# Patient Record
Sex: Female | Born: 1935 | Race: White | Hispanic: No | Marital: Single | Smoking: Never smoker
Health system: Southern US, Community
[De-identification: ages and names within clinical notes are randomized; demographics above are authoritative.]

## PROBLEM LIST (undated history)

## (undated) DIAGNOSIS — I10 Essential (primary) hypertension: Secondary | ICD-10-CM

## (undated) DIAGNOSIS — I639 Cerebral infarction, unspecified: Secondary | ICD-10-CM

---

## 2015-09-14 ENCOUNTER — Inpatient Hospital Stay (HOSPITAL_COMMUNITY)
Admission: EM | Admit: 2015-09-14 | Discharge: 2015-09-21 | DRG: 064 | Disposition: A | Discharge status: Hospice | Payer: Medicare Other | Attending: Internal Medicine | Admitting: Internal Medicine

## 2015-09-14 ENCOUNTER — Emergency Department (HOSPITAL_COMMUNITY): Payer: Medicare Other

## 2015-09-14 ENCOUNTER — Encounter (HOSPITAL_COMMUNITY): Payer: Self-pay | Admitting: Emergency Medicine

## 2015-09-14 DIAGNOSIS — Z7902 Long term (current) use of antithrombotics/antiplatelets: Secondary | ICD-10-CM

## 2015-09-14 DIAGNOSIS — I6789 Other cerebrovascular disease: Secondary | ICD-10-CM | POA: Diagnosis present

## 2015-09-14 DIAGNOSIS — E46 Unspecified protein-calorie malnutrition: Secondary | ICD-10-CM | POA: Diagnosis present

## 2015-09-14 DIAGNOSIS — G9389 Other specified disorders of brain: Secondary | ICD-10-CM | POA: Diagnosis present

## 2015-09-14 DIAGNOSIS — E785 Hyperlipidemia, unspecified: Secondary | ICD-10-CM | POA: Diagnosis present

## 2015-09-14 DIAGNOSIS — I480 Paroxysmal atrial fibrillation: Secondary | ICD-10-CM | POA: Diagnosis not present

## 2015-09-14 DIAGNOSIS — R4182 Altered mental status, unspecified: Secondary | ICD-10-CM | POA: Diagnosis present

## 2015-09-14 DIAGNOSIS — D649 Anemia, unspecified: Secondary | ICD-10-CM | POA: Diagnosis present

## 2015-09-14 DIAGNOSIS — I248 Other forms of acute ischemic heart disease: Secondary | ICD-10-CM | POA: Diagnosis present

## 2015-09-14 DIAGNOSIS — I638 Other cerebral infarction: Secondary | ICD-10-CM | POA: Diagnosis not present

## 2015-09-14 DIAGNOSIS — R7989 Other specified abnormal findings of blood chemistry: Secondary | ICD-10-CM | POA: Diagnosis not present

## 2015-09-14 DIAGNOSIS — Z8249 Family history of ischemic heart disease and other diseases of the circulatory system: Secondary | ICD-10-CM

## 2015-09-14 DIAGNOSIS — W19XXXA Unspecified fall, initial encounter: Secondary | ICD-10-CM | POA: Diagnosis present

## 2015-09-14 DIAGNOSIS — R109 Unspecified abdominal pain: Secondary | ICD-10-CM

## 2015-09-14 DIAGNOSIS — R001 Bradycardia, unspecified: Secondary | ICD-10-CM | POA: Diagnosis present

## 2015-09-14 DIAGNOSIS — R1310 Dysphagia, unspecified: Secondary | ICD-10-CM | POA: Diagnosis present

## 2015-09-14 DIAGNOSIS — I639 Cerebral infarction, unspecified: Secondary | ICD-10-CM

## 2015-09-14 DIAGNOSIS — Z7189 Other specified counseling: Secondary | ICD-10-CM | POA: Diagnosis not present

## 2015-09-14 DIAGNOSIS — G9340 Encephalopathy, unspecified: Secondary | ICD-10-CM | POA: Diagnosis present

## 2015-09-14 DIAGNOSIS — I63411 Cerebral infarction due to embolism of right middle cerebral artery: Principal | ICD-10-CM | POA: Diagnosis present

## 2015-09-14 DIAGNOSIS — R339 Retention of urine, unspecified: Secondary | ICD-10-CM | POA: Diagnosis present

## 2015-09-14 DIAGNOSIS — I10 Essential (primary) hypertension: Secondary | ICD-10-CM | POA: Diagnosis present

## 2015-09-14 DIAGNOSIS — R29719 NIHSS score 19: Secondary | ICD-10-CM | POA: Diagnosis present

## 2015-09-14 DIAGNOSIS — Z515 Encounter for palliative care: Secondary | ICD-10-CM | POA: Diagnosis not present

## 2015-09-14 DIAGNOSIS — N183 Chronic kidney disease, stage 3 (moderate): Secondary | ICD-10-CM | POA: Diagnosis present

## 2015-09-14 DIAGNOSIS — G936 Cerebral edema: Secondary | ICD-10-CM | POA: Diagnosis present

## 2015-09-14 DIAGNOSIS — R402112 Coma scale, eyes open, never, at arrival to emergency department: Secondary | ICD-10-CM | POA: Diagnosis present

## 2015-09-14 DIAGNOSIS — R402352 Coma scale, best motor response, localizes pain, at arrival to emergency department: Secondary | ICD-10-CM | POA: Diagnosis present

## 2015-09-14 DIAGNOSIS — Z8673 Personal history of transient ischemic attack (TIA), and cerebral infarction without residual deficits: Secondary | ICD-10-CM

## 2015-09-14 DIAGNOSIS — Z823 Family history of stroke: Secondary | ICD-10-CM

## 2015-09-14 DIAGNOSIS — G8324 Monoplegia of upper limb affecting left nondominant side: Secondary | ICD-10-CM | POA: Diagnosis present

## 2015-09-14 DIAGNOSIS — Z4659 Encounter for fitting and adjustment of other gastrointestinal appliance and device: Secondary | ICD-10-CM | POA: Insufficient documentation

## 2015-09-14 DIAGNOSIS — R100 Acute abdomen: Secondary | ICD-10-CM | POA: Diagnosis not present

## 2015-09-14 DIAGNOSIS — I454 Nonspecific intraventricular block: Secondary | ICD-10-CM | POA: Diagnosis present

## 2015-09-14 DIAGNOSIS — Z87898 Personal history of other specified conditions: Secondary | ICD-10-CM | POA: Diagnosis not present

## 2015-09-14 DIAGNOSIS — I447 Left bundle-branch block, unspecified: Secondary | ICD-10-CM | POA: Diagnosis present

## 2015-09-14 DIAGNOSIS — Z66 Do not resuscitate: Secondary | ICD-10-CM | POA: Diagnosis not present

## 2015-09-14 DIAGNOSIS — I6523 Occlusion and stenosis of bilateral carotid arteries: Secondary | ICD-10-CM | POA: Diagnosis present

## 2015-09-14 DIAGNOSIS — I69320 Aphasia following cerebral infarction: Secondary | ICD-10-CM | POA: Diagnosis not present

## 2015-09-14 DIAGNOSIS — I6389 Other cerebral infarction: Secondary | ICD-10-CM

## 2015-09-14 DIAGNOSIS — R402232 Coma scale, best verbal response, inappropriate words, at arrival to emergency department: Secondary | ICD-10-CM | POA: Diagnosis present

## 2015-09-14 DIAGNOSIS — I129 Hypertensive chronic kidney disease with stage 1 through stage 4 chronic kidney disease, or unspecified chronic kidney disease: Secondary | ICD-10-CM | POA: Diagnosis present

## 2015-09-14 DIAGNOSIS — Z7982 Long term (current) use of aspirin: Secondary | ICD-10-CM

## 2015-09-14 DIAGNOSIS — I4891 Unspecified atrial fibrillation: Secondary | ICD-10-CM | POA: Diagnosis not present

## 2015-09-14 DIAGNOSIS — S0012XA Contusion of left eyelid and periocular area, initial encounter: Secondary | ICD-10-CM | POA: Diagnosis present

## 2015-09-14 DIAGNOSIS — Z79899 Other long term (current) drug therapy: Secondary | ICD-10-CM

## 2015-09-14 DIAGNOSIS — I517 Cardiomegaly: Secondary | ICD-10-CM | POA: Diagnosis present

## 2015-09-14 DIAGNOSIS — S0003XA Contusion of scalp, initial encounter: Secondary | ICD-10-CM | POA: Diagnosis present

## 2015-09-14 DIAGNOSIS — Y92009 Unspecified place in unspecified non-institutional (private) residence as the place of occurrence of the external cause: Secondary | ICD-10-CM | POA: Diagnosis not present

## 2015-09-14 HISTORY — DX: Cerebral infarction, unspecified: I63.9

## 2015-09-14 HISTORY — DX: Essential (primary) hypertension: I10

## 2015-09-14 LAB — COMPREHENSIVE METABOLIC PANEL
ALT: 17 U/L (ref 14–54)
ANION GAP: 11 (ref 5–15)
AST: 29 U/L (ref 15–41)
Albumin: 3.6 g/dL (ref 3.5–5.0)
Alkaline Phosphatase: 110 U/L (ref 38–126)
BILIRUBIN TOTAL: 0.9 mg/dL (ref 0.3–1.2)
BUN: 19 mg/dL (ref 6–20)
CO2: 21 mmol/L — ABNORMAL LOW (ref 22–32)
Calcium: 9.4 mg/dL (ref 8.9–10.3)
Chloride: 106 mmol/L (ref 101–111)
Creatinine, Ser: 1.29 mg/dL — ABNORMAL HIGH (ref 0.44–1.00)
GFR, EST AFRICAN AMERICAN: 42 mL/min — AB (ref >60)
GFR, EST NON AFRICAN AMERICAN: 36 mL/min — AB (ref >60)
Glucose, Bld: 129 mg/dL — ABNORMAL HIGH (ref 65–99)
POTASSIUM: 4.4 mmol/L (ref 3.5–5.1)
Sodium: 138 mmol/L (ref 135–145)
TOTAL PROTEIN: 6.9 g/dL (ref 6.5–8.1)

## 2015-09-14 LAB — CBC WITH DIFFERENTIAL/PLATELET
BASOS ABS: 0 10*3/uL (ref 0.0–0.1)
Basophils Relative: 0 %
EOS PCT: 1 %
Eosinophils Absolute: 0.1 10*3/uL (ref 0.0–0.7)
HEMATOCRIT: 34.9 % — AB (ref 36.0–46.0)
HEMOGLOBIN: 11.4 g/dL — AB (ref 12.0–15.0)
LYMPHS PCT: 20 %
Lymphs Abs: 1.6 10*3/uL (ref 0.7–4.0)
MCH: 31.8 pg (ref 26.0–34.0)
MCHC: 32.7 g/dL (ref 30.0–36.0)
MCV: 97.5 fL (ref 78.0–100.0)
Monocytes Absolute: 0.7 10*3/uL (ref 0.1–1.0)
Monocytes Relative: 9 %
NEUTROS ABS: 5.5 10*3/uL (ref 1.7–7.7)
NEUTROS PCT: 70 %
PLATELETS: 243 10*3/uL (ref 150–400)
RBC: 3.58 MIL/uL — AB (ref 3.87–5.11)
RDW: 13.6 % (ref 11.5–15.5)
WBC: 7.8 10*3/uL (ref 4.0–10.5)

## 2015-09-14 MED ORDER — ASPIRIN 300 MG RE SUPP
300.0000 mg | Freq: Every day | RECTAL | Status: DC
  Administered 2015-09-15 – 2015-09-16 (×2): 300 mg via RECTAL
  Filled 2015-09-14 (×2): qty 1

## 2015-09-14 MED ORDER — ASPIRIN 325 MG PO TABS
325.0000 mg | ORAL_TABLET | Freq: Every day | ORAL | Status: DC
  Administered 2015-09-17 – 2015-09-20 (×4): 325 mg via ORAL
  Filled 2015-09-14 (×4): qty 1

## 2015-09-14 MED ORDER — ACETAMINOPHEN 325 MG PO TABS
650.0000 mg | ORAL_TABLET | ORAL | Status: DC | PRN
  Administered 2015-09-18 – 2015-09-20 (×2): 650 mg via ORAL
  Filled 2015-09-14 (×2): qty 2

## 2015-09-14 MED ORDER — SODIUM CHLORIDE 0.9 % IV BOLUS (SEPSIS)
500.0000 mL | Freq: Once | INTRAVENOUS | Status: AC
  Administered 2015-09-15: 500 mL via INTRAVENOUS

## 2015-09-14 MED ORDER — SENNOSIDES-DOCUSATE SODIUM 8.6-50 MG PO TABS: 1.0000 | ORAL_TABLET | Freq: Every evening | ORAL | Status: DC | PRN

## 2015-09-14 MED ORDER — STROKE: EARLY STAGES OF RECOVERY BOOK
Freq: Once | Status: AC
  Administered 2015-09-15: 01:00:00

## 2015-09-14 MED ORDER — ACETAMINOPHEN 650 MG RE SUPP
650.0000 mg | RECTAL | Status: DC | PRN
  Administered 2015-09-15: 650 mg via RECTAL
  Filled 2015-09-14: qty 1

## 2015-09-14 NOTE — H&P (Signed)
History and Physical  Patient Name: Alexis Madden     VOJ:[id number]    DOB: 1926/05/01    DOA: 09/14/2015 Referring physician: Pixie Casino, PA-C PCP: No primary care provider on file.      Chief Complaint: Fall and AMS  HPI: Alexis Madden is a 82 y.o. female with a past medical history significant for CVA x2 with residual dysarthria and HTN who presents with stroke.  All history collected from family, as patient is unable to provide own history given mental status.  The patient lives alone, her 2 children live nearby and checks on her daily. She ambulates with a cane and is independent with all ADLs, but they provide meals for her.  Today she was last seen normal at 1 PM, when they returned at 5 PM they found her on the floor of her house. She seemed awake and had no obvious focal weakness, but appeared globally weak and they had trouble getting her up to a chair. EMS was called who found her hypertensive and brought the patient to the ER.  In the ED, the patient responded to painful stimuli only and globally weak.  CT head without contrast showed a large RIGHT parietal infarct and tPA was not administered because of this and unknown history/outside window.   Ha 138, K 4.4, Cr 1.29 (eGFR 36, unknown baseline), Hgb 11.4 normocytic, normal platelets and WBC 7.8K.  TRH were asked to evaluate for admission.        Review of Systems:  Unable to obtain due to patient mentation.  No Known Allergies  Prior to Admission medications   Medication Sig Start Date End Date Taking? Authorizing Provider  amLODipine (NORVASC) 2.5 MG tablet Take 2.5 mg by mouth daily.   Yes Historical Provider, MD  aspirin EC 81 MG tablet Take 162 mg by mouth at bedtime.   Yes Historical Provider, MD  clopidogrel (PLAVIX) 75 MG tablet Take 75 mg by mouth daily.   Yes Historical Provider, MD  Folic Acid-Vit X9-BZJ I96 (FOLBEE) 2.5-25-1 MG TABS tablet Take 1 tablet by mouth daily.   Yes Historical Provider, MD    losartan (COZAAR) 100 MG tablet Take 100 mg by mouth daily.   Yes Historical Provider, MD  lovastatin (MEVACOR) 40 MG tablet Take 40 mg by mouth at bedtime.   Yes Historical Provider, MD  METOPROLOL SUCCINATE ER PO Take 1 tablet by mouth 2 (two) times daily.   Yes Historical Provider, MD    Past Medical History  Diagnosis Date  . Stroke ([place])   . Hypertension     History reviewed. No pertinent past surgical history.  Family history: family history includes Hypertension in her other; Stroke in her mother.  Social History: Patient lives by herself.  She has two Children who live nearby who are with her most days during the day. She is able to be alone for several hours at a time. She has aphasia or dysarthria at baseline since her last one year ago and is usually not possible to understand. She uses a cane. She is not a smoker.       Physical Exam: BP 174/96 mmHg  Pulse 66  Temp(Src) 98.4 F (36.9 C) (Oral)  Resp 16  SpO2 95% General appearance: Elderly  female, eyes closed, appears to be sleeping, does not open eyes to voice, responds to noxious stimuli, intermittently follows simple commands. Eyes: Anicteric, conjunctiva pink, lids and lashes normal.  Bruising over left eye. ENT: No nasal deformity, discharge,  or epistaxis.  Lips dry, mouth not examined.  Neck normal, trachea midline.   Lymph: No cervical or supraclavicular lymphadenopathy. Skin: Warm and dry.  Bruising on upper chest and left eye.  No suspicious rashes or lesions on face, neck, back, arms, chest, abdomen.  Venous stasis changes to legs. Cardiac: RRR, nl S1-S2, soft early peaking SEM at base.  Capillary refill is brisk.  JVP not visible.  No LE edema.  Radial and DP pulses 2+ and symmetric. Respiratory: Normal respiratory rate and rhythm.  CTAB without rales or wheezes. Abdomen: Abdomen soft without rigidity.  Diffuse TTP guarding, no masses or focal tenderness, no rigidity. No ascites, distension.   MSK: No  deformities or effusions. Neuro: Eyes closed, does not respond to voice and does not consistently follow commands.  Face symmetric.  PERRLA.  Globally weak, but able to lift both arms and grip.  Babinski downgoing.   Further exam limited. Psych: Unable to assess.      Labs on Admission:  The metabolic panel shows slightly elevated creatinine. The complete blood count shows normocytic anemia.   Radiological Exams on Admission: Ct Head Wo Contrast 09/14/2015  IMPRESSION: Acute appearing infarct right parietal lobe with cytotoxic edema. Old infarcts involving the left frontal lobe, posterior left temporal lobe, and much of the left occipital lobe. A small prior infarct in the medial right occipital lobe is also evident. There is moderate small vessel disease throughout the centra semiovale bilaterally. No intracranial hemorrhage. No extra-axial fluid collections. There is a left frontal scalp hematoma. No fracture evident.   Personally reviewed: Dg Chest Port 1 View 09/14/2015  Clear without focal opacity.   EKG: Independently reviewed. Rate 75, LBBB.  No previous available for comparison.    Assessment/Plan 1. Acute Stroke/TIA:  This is new.  Two previous strokes with patient slurred and unable to speak since last 1 year ago, without muscle deficits.  Patient lives alone with assistance from family who look in on her every day.  Now, CT head shows parietal RIGHT infarct.   -Admit to telemetry -Neuro checks, NIHSS per protocol -Daily aspirin 325 mg -Permissive hypertension for now -Lipids, hemoglobin A1c -Carotid doppler, MRA or CT angiography of head and neck per Neurology, ordered -Echocardiogram ordered -PT/OT/SLP consultation -Consult to Neurology, appreciate recommendations -Restart Plavix when able to take PO   2. Elevated creatinine:  Chronicity and baseline unknown. -Fluids and trend BMP -Check urinalysis  3. HTN:  -Permissive hypertension for now, hold losartan,  amlodipine, metoprolol  4. Anemia:  Also, unclear etiology/chronicity.  Normocytic, likely from renal disease. -Check ferritin      DVT PPx: SCDs Diet: Heart healthy if passes swallow screen Consultants: Neurology Code Status: DO NOT RESUSCITATE Family Communication: Daughter and son and daughter in law at bedside. POA is son, CODE STATUS discussed. Medical decision making: The case was discussed with [facility] and Dr. Nicole Kindred. Patient seen 10:15 PM on 09/14/2015.  Disposition Plan:  I recommend admission to telemetry, inpatient status.  Clinical condition: stable.  Anticipate evaluation by ancillary services, secondary prevention regimen to be started by Neurology, placement likely needed.      Edwin Dada [facility] Pager [phone]

## 2015-09-14 NOTE — ED Notes (Signed)
Unable to review pts hx, allergies, medications or any other screening questions due to pts mental status. Family in route with information.

## 2015-09-14 NOTE — ED Notes (Signed)
Per EMS, pt coming from home pt was last seen at 1330 today and was normal to baseline. At baseline pt has expressive aphasia but is alert. Pt was then found by family face down on the floor around 1630. Pt is altered at this time and only responds to painful stimuli. Pt does have obvious hematoma to the left forehead and is on a blood thinner.

## 2015-09-14 NOTE — Consult Note (Signed)
Admission H&P    Chief Complaint: Acute recurrent stroke.  HPI: Alexis Madden is an 82 y.o. female with a history of hypertension and hyperlipidemia as well as previous left cerebral infarctions with residual aphasia, brought to the emergency room after being found on the floor of the home [place] and. She was last seen well at about 1:30 PM. She was noted to have ecchymosis and swelling involving the left frontal and periorbital region. She was stuporous and has remained stuporous. CT scan of her head showed an acute right parietal area of hypoattenuation, likely acute infarction, in addition to previous left cerebral infarctions. Patient has been taking aspirin and Plavix daily.  LSN: 1:30 PM on 09/14/2015 tPA Given: No: Large acute cerebral infarction demonstrated on CT scan; beyond time window for treatment consideration. mRankin:  Past Medical History  Diagnosis Date  . Stroke ([place])   . Hypertension     History reviewed. No pertinent past surgical history.  Family History  Problem Relation Age of Onset  . Stroke Mother   . Hypertension Other    Social History:  reports that she has never smoked. She has never used smokeless tobacco. She reports that she does not drink alcohol or use illicit drugs.  Allergies: No Known Allergies  Medications Prior to Admission  Medication Sig Dispense Refill  . amLODipine (NORVASC) 2.5 MG tablet Take 2.5 mg by mouth daily.    Marland Kitchen aspirin EC 81 MG tablet Take 162 mg by mouth at bedtime.    . clopidogrel (PLAVIX) 75 MG tablet Take 75 mg by mouth daily.    . Folic Acid-Vit Z6-XWR U04 (FOLBEE) 2.5-25-1 MG TABS tablet Take 1 tablet by mouth daily.    Marland Kitchen losartan (COZAAR) 100 MG tablet Take 100 mg by mouth daily.    Marland Kitchen lovastatin (MEVACOR) 40 MG tablet Take 40 mg by mouth at bedtime.    Marland Kitchen METOPROLOL SUCCINATE ER PO Take 1 tablet by mouth 2 (two) times daily.      ROS: History obtained from patient's daughter  General ROS: negative for - chills,  fatigue, fever, night sweats, weight gain or weight loss Psychological ROS: negative for - behavioral disorder, hallucinations, memory difficulties, mood swings or suicidal ideation Ophthalmic ROS: negative for - blurry vision, double vision, eye pain or loss of vision ENT ROS: negative for - epistaxis, nasal discharge, oral lesions, sore throat, tinnitus or vertigo Allergy and Immunology ROS: negative for - hives or itchy/watery eyes Hematological and Lymphatic ROS: negative for - bleeding problems, bruising or swollen lymph nodes Endocrine ROS: negative for - galactorrhea, hair pattern changes, polydipsia/polyuria or temperature intolerance Respiratory ROS: negative for - cough, hemoptysis, shortness of breath or wheezing Cardiovascular ROS: negative for - chest pain, dyspnea on exertion, edema or irregular heartbeat Gastrointestinal ROS: negative for - abdominal pain, diarrhea, hematemesis, nausea/vomiting or stool incontinence Genito-Urinary ROS: negative for - dysuria, hematuria, incontinence or urinary frequency/urgency Musculoskeletal ROS: negative for - joint swelling or muscular weakness Neurological ROS: as noted in HPI Dermatological ROS: negative for rash and skin lesion changes  Physical Examination: Blood pressure 174/96, pulse 66, temperature 98.4 F (36.9 C), temperature source Oral, resp. rate 16, SpO2 95 %.  HEENT-  Normocephalic, no lesions, without obvious abnormality.  Normal external eye and conjunctiva.  Normal TM's bilaterally.  Normal auditory canals and external ears. Normal external nose, mucus membranes and septum.  Normal pharynx. Neck supple with no masses, nodes, nodules or enlargement. Cardiovascular - regular rate and rhythm, S1, S2 normal,  no murmur, click, rub or gallop Lungs - chest clear, no wheezing, rales, normal symmetric air entry, Heart exam - S1, S2 normal, no murmur, no gallop, rate regular Abdomen - soft, non-tender; bowel sounds normal; no masses,   no organomegaly Extremities - no joint deformities, effusion, or inflammation and no edema  Neurologic Examination: Mental Status: Patient was stuporous with no verbal output when partially arouse. He did not follow simple commands. Cranial Nerves: Unable to adequately evaluate due to mental status. III/IV/VI-Pupils were equal and reacted. Extraocular movements were full on right left lateral gaze with oculocephalic maneuvers.    VII-moderate left lower facial weakness. VIII-normal. X-garbled verbal output in response to noxious stimuli. Motor: Reduced spontaneous movements of left extremities compared to right extremities; pain with passive movement of left upper extremity. Deep Tendon Reflexes: 2+ and symmetric. Plantars: Flexor bilaterally Cerebellar: Not tested. Carotid auscultation: Normal  Results for orders placed or performed during the hospital encounter of 09/14/15 (from the past 48 hour(s))  Comprehensive metabolic panel     Status: Abnormal   Collection Time: 09/14/15  7:59 PM  Result Value Ref Range   Sodium 138 135 - 145 mmol/L   Potassium 4.4 3.5 - 5.1 mmol/L   Chloride 106 101 - 111 mmol/L   CO2 21 (L) 22 - 32 mmol/L   Glucose, Bld 129 (H) 65 - 99 mg/dL   BUN 19 6 - 20 mg/dL   Creatinine, Ser 1.29 (H) 0.44 - 1.00 mg/dL   Calcium 9.4 8.9 - 10.3 mg/dL   Total Protein 6.9 6.5 - 8.1 g/dL   Albumin 3.6 3.5 - 5.0 g/dL   AST 29 15 - 41 U/L   ALT 17 14 - 54 U/L   Alkaline Phosphatase 110 38 - 126 U/L   Total Bilirubin 0.9 0.3 - 1.2 mg/dL   GFR calc non Af Amer 36 (L) >60 mL/min   GFR calc Af Amer 42 (L) >60 mL/min    Comment: (NOTE) The eGFR has been calculated using the CKD EPI equation. This calculation has not been validated in all clinical situations. eGFR's persistently <60 mL/min signify possible Chronic Kidney Disease.    Anion gap 11 5 - 15  CBC with Differential     Status: Abnormal   Collection Time: 09/14/15  7:59 PM  Result Value Ref Range   WBC 7.8  4.0 - 10.5 K/uL   RBC 3.58 (L) 3.87 - 5.11 MIL/uL   Hemoglobin 11.4 (L) 12.0 - 15.0 g/dL   HCT 34.9 (L) 36.0 - 46.0 %   MCV 97.5 78.0 - 100.0 fL   MCH 31.8 26.0 - 34.0 pg   MCHC 32.7 30.0 - 36.0 g/dL   RDW 13.6 11.5 - 15.5 %   Platelets 243 150 - 400 K/uL   Neutrophils Relative % 70 %   Neutro Abs 5.5 1.7 - 7.7 K/uL   Lymphocytes Relative 20 %   Lymphs Abs 1.6 0.7 - 4.0 K/uL   Monocytes Relative 9 %   Monocytes Absolute 0.7 0.1 - 1.0 K/uL   Eosinophils Relative 1 %   Eosinophils Absolute 0.1 0.0 - 0.7 K/uL   Basophils Relative 0 %   Basophils Absolute 0.0 0.0 - 0.1 K/uL   Ct Head Wo Contrast  09/14/2015  CLINICAL DATA:  Altered mental status with apparent fall EXAM: CT HEAD WITHOUT CONTRAST TECHNIQUE: Contiguous axial images were obtained from the base of the skull through the vertex without intravenous contrast. COMPARISON:  None. FINDINGS: There is moderate  diffuse atrophy. There is no intracranial mass, hemorrhage, extra-axial fluid collection, or midline shift. There is evidence of prior infarcts involving the posterior temporal and anterior to mid left occipital lobes as well as in the mid left frontal lobe. There is small vessel disease throughout the centra semiovale bilaterally. There is evidence of a prior small infarct in the posterior medial right occipital lobe. There is an acute appearing infarct in the right parietal lobe with cytotoxic edema in this area. No other acute infarct is evident. The bony calvarium appears intact. There is a left frontal scalp hematoma. The mastoid air cells are clear. Orbits appear symmetric bilaterally. IMPRESSION: Acute appearing infarct right parietal lobe with cytotoxic edema. Old infarcts involving the left frontal lobe, posterior left temporal lobe, and much of the left occipital lobe. A small prior infarct in the medial right occipital lobe is also evident. There is moderate small vessel disease throughout the centra semiovale bilaterally. No  intracranial hemorrhage. No extra-axial fluid collections. There is a left frontal scalp hematoma. No fracture evident. Electronically Signed   By: Lowella Grip III M.D.   On: 09/14/2015 20:45   Dg Chest Port 1 View  09/14/2015  CLINICAL DATA:  Fall EXAM: PORTABLE CHEST 1 VIEW COMPARISON:  None. FINDINGS: Lungs are clear. Heart is enlarged with pulmonary vascularity within normal limits. No adenopathy. There is a skin fold on the right but no pneumothorax. No fractures are evident. There is degenerative change in the shoulders bilaterally. Atherosclerotic calcification is noted in the aortic arch region. IMPRESSION: No edema or consolidation. There is cardiomegaly. There is a skin fold on the right but no apparent pneumothorax. Electronically Signed   By: Lowella Grip III M.D.   On: 09/14/2015 19:34    Assessment: 82 y.o. female with multiple risk factors for stroke as well as previous cerebral infarctions presenting with acute right parietal ischemic infarction.  Stroke Risk Factors - family history, hyperlipidemia and hypertension  Plan: 1. HgbA1c, fasting lipid panel 2. MRI, MRA  of the brain without contrast 3. PT consult, OT consult, Speech consult 4. Echocardiogram 5. Carotid dopplers 6. Prophylactic therapy-continue aspirin and Plavix 7. Risk factor modification 8. Telemetry monitoring  C.R. Nicole Kindred, MD [facility] [phone]  09/14/2015, 10:56 PM

## 2015-09-14 NOTE — ED Notes (Signed)
Held almost 10 minutes waiting to give report.

## 2015-09-14 NOTE — ED Provider Notes (Signed)
Medical screening examination/treatment/procedure(s) were conducted as a shared visit with non-physician practitioner(s) and myself.  I personally evaluated the patient during the encounter.  Last seen well around noon, family came back this afternoon and pateint with obvious trauma to left forehead and left orbit, decreased LOC, brought here for eval. Here if you open her eyelids she will obey commands. , otherwise will not move extremities.  Concern for head bleed vs. Concussion. Will ct and check other labs and disposition appropriately.   Ct w/ e/o acute infarct. Last seen well at 1200 so no code stroke done. Plan for neuro consult and IM admission.   CRITICAL CARE Performed by: Marily MemosMesner, Virdell Hoiland  Total critical care time: 35 minutes Critical care time was exclusive of separately billable procedures and treating other patients. Critical care was necessary to treat or prevent imminent or life-threatening deterioration. Critical care was time spent personally by me on the following activities: development of treatment plan with patient and/or surrogate as well as nursing, discussions with consultants, evaluation of patient's response to treatment, examination of patient, obtaining history from patient or surrogate, ordering and performing treatments and interventions, ordering and review of laboratory studies, ordering and review of radiographic studies, pulse oximetry and re-evaluation of patient's condition.   Marily MemosJason Sharyon Peitz, MD 09/15/15 1101

## 2015-09-14 NOTE — Progress Notes (Addendum)
Patient arrived to 205M03. Patient is alert, oriented to self, expressive aphasia prevents patient from answering other orientation questions. Patient unable to follow all commands, skin is intact, patient moves self around in bed often, no signs of distress, patient's son Alexis Madden and wife Alexis Madden updated on plan of care, unit, staff. Call bell within patient's reach, bed alarm on, will continue to monitor patient closely. Patient currently too drowsy to complete stroke swallow screen, will keep patient npo until able to stay awake for swallow screen. Q2 vitals and neuro checks started at 11:00 pm, scds ordered for patient  MD danford came to patient's bedside, spoke w family upon patient's arrival to floor  Addendum: patient has ecchymosis on anterior chest with possible hematoma, multiple ecchymosis on right and left arms, left eye is also bruised, tender, not open. Clean.

## 2015-09-14 NOTE — ED Provider Notes (Signed)
CSN: [id number]     Arrival date & time 09/14/15  1902 History   First MD Initiated Contact with Patient 09/14/15 1912     Chief Complaint  Patient presents with  . Altered Mental Status  . Fall    (Consider location/radiation/quality/duration/timing/severity/associated sxs/prior Treatment) Patient is a 82 y.o. female presenting with altered mental status and fall. The history is provided by a relative and medical records. No language interpreter was used.  Altered Mental Status Fall  Alexis Madden is a 82 y.o. female  with a PMH of HTN and prior stroke who presents to the Emergency Department via EMS for altered mental status. Per son and daughter at bedside, patient has expressive aphasia at baseline 2/2 prior stroke, but lives alone, able to perform ADL's, feed herself, ambulate by herself, etc. She was last seen normal at approximately 12-1PM this afternoon. Daughter went to check on her at approximately 5:30 this afternoon and found her laying flat on floor and unresponsive with trauma to left forehead which daughter assumes is from a fall. Patient is on clopidogrel. No medications given PTA.   Level V caveat applies due to mental status.  Past Medical History  Diagnosis Date  . Stroke (HCC)   . Hypertension    History reviewed. No pertinent past surgical history. History reviewed. No pertinent family history. Social History  Substance Use Topics  . Smoking status: Never Smoker   . Smokeless tobacco: Never Used  . Alcohol Use: No   OB History    No data available     Review of Systems  Unable to perform ROS: Mental status change      Allergies  Review of patient's allergies indicates no known allergies.  Home Medications   Prior to Admission medications   Medication Sig Start Date End Date Taking? Authorizing Provider  amLODipine (NORVASC) 2.5 MG tablet Take 2.5 mg by mouth daily.   Yes Historical Provider, MD  aspirin EC 81 MG tablet Take 162 mg by mouth at  bedtime.   Yes Historical Provider, MD  clopidogrel (PLAVIX) 75 MG tablet Take 75 mg by mouth daily.   Yes Historical Provider, MD  Folic Acid-Vit B6-Vit B12 (FOLBEE) 2.5-25-1 MG TABS tablet Take 1 tablet by mouth daily.   Yes Historical Provider, MD  losartan (COZAAR) 100 MG tablet Take 100 mg by mouth daily.   Yes Historical Provider, MD  lovastatin (MEVACOR) 40 MG tablet Take 40 mg by mouth at bedtime.   Yes Historical Provider, MD  METOPROLOL SUCCINATE ER PO Take 1 tablet by mouth 2 (two) times daily.   Yes Historical Provider, MD   BP 174/96 mmHg  Pulse 66  Temp(Src) 98.4 F (36.9 C) (Oral)  Resp 16  SpO2 95% Physical Exam  Constitutional: She appears well-developed and well-nourished.  HENT:  Head: Head is without raccoon's eyes and without Battle's sign.  Right Ear: No hemotympanum.  Left Ear: No hemotympanum.  Hematoma of left forehead. Bruising of her left eyelid.  Eyes: Conjunctivae are normal. Pupils are equal, round, and reactive to light.  Cardiovascular: Normal rate and regular rhythm.   Pulmonary/Chest: Effort normal and breath sounds normal. No respiratory distress.  Abdominal: Soft. Bowel sounds are normal. She exhibits no distension. There is no tenderness.  Musculoskeletal:  Decreased strength of left upper extremity  Neurological:  Altered. Responds to painful stimuli. Will follow basic commands at times.  Skin: Skin is warm and dry.  Nursing note and vitals reviewed.   ED Course  Procedures (including critical care time) Labs Review Labs Reviewed  COMPREHENSIVE METABOLIC PANEL - Abnormal; Notable for the following:    CO2 21 (*)    Glucose, Bld 129 (*)    Creatinine, Ser 1.29 (*)    GFR calc non Af Amer 36 (*)    GFR calc Af Amer 42 (*)    All other components within normal limits  CBC WITH DIFFERENTIAL/PLATELET - Abnormal; Notable for the following:    RBC 3.58 (*)    Hemoglobin 11.4 (*)    HCT 34.9 (*)    All other components within normal limits   URINALYSIS, ROUTINE W REFLEX MICROSCOPIC (NOT AT [facility])  CBG MONITORING, ED    Imaging Review Ct Head Wo Contrast  09/14/2015  CLINICAL DATA:  Altered mental status with apparent fall EXAM: CT HEAD WITHOUT CONTRAST TECHNIQUE: Contiguous axial images were obtained from the base of the skull through the vertex without intravenous contrast. COMPARISON:  None. FINDINGS: There is moderate diffuse atrophy. There is no intracranial mass, hemorrhage, extra-axial fluid collection, or midline shift. There is evidence of prior infarcts involving the posterior temporal and anterior to mid left occipital lobes as well as in the mid left frontal lobe. There is small vessel disease throughout the centra semiovale bilaterally. There is evidence of a prior small infarct in the posterior medial right occipital lobe. There is an acute appearing infarct in the right parietal lobe with cytotoxic edema in this area. No other acute infarct is evident. The bony calvarium appears intact. There is a left frontal scalp hematoma. The mastoid air cells are clear. Orbits appear symmetric bilaterally. IMPRESSION: Acute appearing infarct right parietal lobe with cytotoxic edema. Old infarcts involving the left frontal lobe, posterior left temporal lobe, and much of the left occipital lobe. A small prior infarct in the medial right occipital lobe is also evident. There is moderate small vessel disease throughout the centra semiovale bilaterally. No intracranial hemorrhage. No extra-axial fluid collections. There is a left frontal scalp hematoma. No fracture evident. Electronically Signed   By: Bretta BangWilliam  Woodruff III M.D.   On: 09/14/2015 20:45   Dg Chest Port 1 View  09/14/2015  CLINICAL DATA:  Fall EXAM: PORTABLE CHEST 1 VIEW COMPARISON:  None. FINDINGS: Lungs are clear. Heart is enlarged with pulmonary vascularity within normal limits. No adenopathy. There is a skin fold on the right but no pneumothorax. No fractures are evident. There  is degenerative change in the shoulders bilaterally. Atherosclerotic calcification is noted in the aortic arch region. IMPRESSION: No edema or consolidation. There is cardiomegaly. There is a skin fold on the right but no apparent pneumothorax. Electronically Signed   By: Bretta BangWilliam  Woodruff III M.D.   On: 09/14/2015 19:34   I have personally reviewed and evaluated these images and lab results as part of my medical decision-making.   EKG Interpretation None      MDM   Final diagnoses:  Infarction of parietal lobe (HCC)   Alexis Madden is a 82 y.o. female who presents with family to ED after being found down approximately an hour and a half prior to arrival. Last seen normal was approximately noon (7 hours PTA). Per family at bedside, significant mental status change from baseline. CBC and CMP reviewed. Chest x-ray with cardiomegaly, no edema or consolidation. Head CT shows an acute appearing infarct of the right parietal lobe. Neurology, Dr. Roseanne RenoStewart, was consulted who recommends admission and patient will be seen by neurology in consult. Consult to the hospitalist,  Dr. Maryfrances Bunnell who will admit.   Patient seen by and discussed with Dr. Clayborne Dana who agrees with treatment plan.    [facility] Charvi Gammage, PA-C 09/14/15 2151  Marily Memos, MD 09/15/15 1102

## 2015-09-14 NOTE — Progress Notes (Signed)
This RN unaware that RN was on hold for report. RN accepted report as soon as I was made aware of nurse calling to give report.

## 2015-09-15 ENCOUNTER — Other Ambulatory Visit (HOSPITAL_COMMUNITY): Payer: Medicare Other

## 2015-09-15 ENCOUNTER — Encounter (HOSPITAL_COMMUNITY): Payer: Self-pay | Admitting: Radiology

## 2015-09-15 ENCOUNTER — Inpatient Hospital Stay (HOSPITAL_COMMUNITY): Payer: Medicare Other

## 2015-09-15 DIAGNOSIS — N183 Chronic kidney disease, stage 3 (moderate): Secondary | ICD-10-CM

## 2015-09-15 LAB — BASIC METABOLIC PANEL
Anion gap: 11 (ref 5–15)
BUN: 16 mg/dL (ref 6–20)
CALCIUM: 8.9 mg/dL (ref 8.9–10.3)
CHLORIDE: 106 mmol/L (ref 101–111)
CO2: 22 mmol/L (ref 22–32)
CREATININE: 1.22 mg/dL — AB (ref 0.44–1.00)
GFR, EST AFRICAN AMERICAN: 44 mL/min — AB (ref >60)
GFR, EST NON AFRICAN AMERICAN: 38 mL/min — AB (ref >60)
Glucose, Bld: 118 mg/dL — ABNORMAL HIGH (ref 65–99)
Potassium: 3.9 mmol/L (ref 3.5–5.1)
SODIUM: 139 mmol/L (ref 135–145)

## 2015-09-15 LAB — URINALYSIS, ROUTINE W REFLEX MICROSCOPIC
Bilirubin Urine: NEGATIVE
GLUCOSE, UA: NEGATIVE mg/dL
KETONES UR: NEGATIVE mg/dL
LEUKOCYTES UA: NEGATIVE
Nitrite: NEGATIVE
PH: 6 (ref 5.0–8.0)
Protein, ur: 30 mg/dL — AB
Specific Gravity, Urine: 1.009 (ref 1.005–1.030)

## 2015-09-15 LAB — URINE MICROSCOPIC-ADD ON: WBC UA: NONE SEEN WBC/hpf (ref 0–5)

## 2015-09-15 LAB — GLUCOSE, CAPILLARY
GLUCOSE-CAPILLARY: 106 mg/dL — AB (ref 65–99)
Glucose-Capillary: 100 mg/dL — ABNORMAL HIGH (ref 65–99)
Glucose-Capillary: 98 mg/dL (ref 65–99)

## 2015-09-15 LAB — CBC
HEMATOCRIT: 31.3 % — AB (ref 36.0–46.0)
Hemoglobin: 10.2 g/dL — ABNORMAL LOW (ref 12.0–15.0)
MCH: 32 pg (ref 26.0–34.0)
MCHC: 32.6 g/dL (ref 30.0–36.0)
MCV: 98.1 fL (ref 78.0–100.0)
Platelets: 215 10*3/uL (ref 150–400)
RBC: 3.19 MIL/uL — ABNORMAL LOW (ref 3.87–5.11)
RDW: 13.5 % (ref 11.5–15.5)
WBC: 7.6 10*3/uL (ref 4.0–10.5)

## 2015-09-15 LAB — FERRITIN: Ferritin: 34 ng/mL (ref 11–307)

## 2015-09-15 LAB — LIPID PANEL
Cholesterol: 138 mg/dL (ref 0–200)
HDL: 56 mg/dL (ref >40)
LDL CALC: 67 mg/dL (ref 0–99)
Total CHOL/HDL Ratio: 2.5 RATIO
Triglycerides: 73 mg/dL (ref <150)
VLDL: 15 mg/dL (ref 0–40)

## 2015-09-15 MED ORDER — PANTOPRAZOLE SODIUM 40 MG IV SOLR
40.0000 mg | Freq: Every day | INTRAVENOUS | Status: DC
  Administered 2015-09-15 – 2015-09-19 (×5): 40 mg via INTRAVENOUS
  Filled 2015-09-15 (×4): qty 40

## 2015-09-15 MED ORDER — SODIUM CHLORIDE 0.9 % IV SOLN
INTRAVENOUS | Status: DC
  Administered 2015-09-15 – 2015-09-20 (×4): via INTRAVENOUS

## 2015-09-15 MED ORDER — LORAZEPAM 2 MG/ML IJ SOLN: 1.0000 mg | Freq: Once | INTRAMUSCULAR | Status: DC

## 2015-09-15 MED ORDER — ENOXAPARIN SODIUM 40 MG/0.4ML ~~LOC~~ SOLN
40.0000 mg | SUBCUTANEOUS | Status: DC
  Administered 2015-09-15 – 2015-09-19 (×5): 40 mg via SUBCUTANEOUS
  Filled 2015-09-15 (×5): qty 0.4

## 2015-09-15 MED ORDER — ENOXAPARIN SODIUM 40 MG/0.4ML ~~LOC~~ SOLN: 40.0000 mg | SUBCUTANEOUS | Status: DC

## 2015-09-15 MED ORDER — IOPAMIDOL (ISOVUE-370) INJECTION 76%
INTRAVENOUS | Status: AC
  Administered 2015-09-15: 50 mL
  Filled 2015-09-15: qty 50

## 2015-09-15 NOTE — Evaluation (Signed)
Clinical/Bedside Swallow Evaluation Patient Details  Name: Alexis ReamerMinnie Madden MRN: [id number] Date of Birth: 04/05/1926  Today's Date: 09/15/2015 Time: SLP Start Time (ACUTE ONLY): 78290923 SLP Stop Time (ACUTE ONLY): 0932 SLP Time Calculation (min) (ACUTE ONLY): 9 min  Past Medical History:  Past Medical History  Diagnosis Date  . Stroke (HCC)   . Hypertension    Past Surgical History: History reviewed. No pertinent past surgical history. HPI:  82 y.o. female with h/o CVA x2 with residual dysarthria/aphasia and HTN who presented to ED with s/s of CVA. Per MD note, pt had last stroke 1 year ago and family has been unable to understand her since. CT Head 4/12 acte appearing infarct R parietal lobe with cytotoxic edema. Old infarct involving the L frontal lobe, posterior L temporal lobe and much of L occipital lobe. Small prior infarct in medial R occipital lobe also evident. CXR 4/12 no edema or consolidation. No prior SLP intervention found in chart.   Assessment / Plan / Recommendation Clinical Impression  Pt presented with ice chip x1, however did not appear aware of bolus once in oral cavity. No pharyngeal swallow initiation observed. No other PO provided due to somnolence and poor awareness. Recommend pt remain NPO. SLP will f/u to determine PO readiness.    Aspiration Risk  Severe aspiration risk    Diet Recommendation NPO   Medication Administration: Via alternative means    Other  Recommendations Oral Care Recommendations: Oral care QID   Follow up Recommendations   (TBD)    Frequency and Duration min 2x/week  2 weeks       Prognosis Prognosis for Safe Diet Advancement: Fair Barriers to Reach Goals: Cognitive deficits;Time post onset;Severity of deficits      Swallow Study   General HPI: 82 y.o. female with h/o CVA x2 with residual dysarthria/aphasia and HTN who presented to ED with s/s of CVA. Per MD note, pt had last stroke 1 year ago and family has been unable to understand  her since. CT Head 4/12 acte appearing infarct R parietal lobe with cytotoxic edema. Old infarct involving the L frontal lobe, posterior L temporal lobe and much of L occipital lobe. Small prior infarct in medial R occipital lobe also evident. CXR 4/12 no edema or consolidation. No prior SLP intervention found in chart. Type of Study: Bedside Swallow Evaluation Previous Swallow Assessment: see HPI Diet Prior to this Study: NPO Temperature Spikes Noted: No Respiratory Status: Room air History of Recent Intubation: No Behavior/Cognition: Lethargic/Drowsy;Requires cueing;Distractible;Confused Oral Cavity Assessment: Within Functional Limits Oral Care Completed by SLP: Yes Oral Cavity - Dentition: Adequate natural dentition Vision:  (unable to determine) Self-Feeding Abilities: Total assist Patient Positioning: Upright in bed Baseline Vocal Quality: Low vocal intensity;Other (comment) (intelligibility reduced) Volitional Cough: Cognitively unable to elicit Volitional Swallow: Unable to elicit    Oral/Motor/Sensory Function Overall Oral Motor/Sensory Function: Within functional limits (limited due to inability to follow commands)   Ice Chips Ice chips: Impaired Presentation: Spoon Oral Phase Impairments: Poor awareness of bolus Oral Phase Functional Implications: Right lateral sulci pocketing;Prolonged oral transit Pharyngeal Phase Impairments: Suspected delayed Swallow   Thin Liquid Thin Liquid: Not tested    Nectar Thick Nectar Thick Liquid: Not tested   Honey Thick Honey Thick Liquid: Not tested   Puree Puree: Not tested   Solid   Jaicob Dia, Student-SLP Solid: Not tested        Lynita LombardLauren Anne Boltz 09/15/2015,10:01 AM

## 2015-09-15 NOTE — Progress Notes (Signed)
NP placed order for ativan once for mri, if patient unable to tolerate mri, rn to notify neurology and wait until am. Patient already coming back from mri, mri to notify rn once they can attempt to scan patient again. RN will administer ativan before

## 2015-09-15 NOTE — Progress Notes (Signed)
OT Cancellation Note  Patient Details Name: Alexis ReamerMinnie Madden MRN: [id number] DOB: 1926-05-05   Cancelled Treatment:    Reason Eval/Treat Not Completed: Patient at procedure or test/ unavailable (Per RN, pt currently at CT). Will follow up for OT eval as time allows.   [name] M.S., OTR/L Pager: [phone]  09/15/2015, 11:56 AM

## 2015-09-15 NOTE — Progress Notes (Signed)
Per MRI, patient unable to stay still to complete mri. On call np notified that patient unable to stay still for mri, pt currently drowsy. No new orders.

## 2015-09-15 NOTE — Progress Notes (Addendum)
[facility] Progress Note  Patient: Alexis Madden:[id number]   PCP: No primary care provider on file. DOB: [birth year]   DOA: 09/14/2015   DOS: 09/15/2015   Date of Service: the patient was seen and examined on 09/15/2015  Subjective: The patient is not communicating verbally and therefore review of system is limited although denies having any acute complaint. Nutrition: Nothing by mouth at present  Brief hospital course: Patient was admitted on 09/14/2015, with complaint of fall, was found to have right parietal stroke. Currently further plan is further stroke workup and therapy.  Assessment and Plan: 1. Acute CVA (cerebrovascular accident) [facility]) The patient presented with a fall. CT scan on admission showed acute right parietal lobe infarct with cytotoxic edema. Repeat CT scan this morning shows moderate to large infarction on the right side. Also left frontal scalp hematoma. Shows mild local mass effect. 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 Patient has known history of carotid artery disease and CT neck shows 64% right ICA stenosis of 42% left ICA stenosis. Speech therapy recommends to maintain the patient nothing by mouth at present due to inability to follow command. Physical therapy recommends SNF. Occupational therapy recommendation are pending. Currently on aspirin suppository 300 mg. Monitor on telemetry. Echocardiogram pending. Neurology consult appreciated.  2. Essential hypertension. Currently allowing permissive hypertension in the setting of acute stroke. Monitor clinically.   3. Chronic kidney disease stage III. Renal functions appears stable will continue to closely monitor. No IV fluids to high blood pressure as well as cytotoxic edema.  Activity: physical therapy SNF Bowel regimen: last BM prior to arrival DVT Prophylaxis: mechanical compression device Nutrition: npo Advance goals of care discussion: full  code  Procedures: Echocardiogram pending Consultants: Neurology Antibiotics: Anti-infectives    None       Family Communication: family was present at bedside, at the time of interview. Her son is POA and Opportunity was given to ask question and all questions were answered satisfactorily.   Disposition:  Expected discharge date: 09/18/2015 Barriers to safe discharge: Further progression of stroke and workup  No intake or output data in the 24 hours ending 09/15/15 1653 Filed Weights   09/14/15 2258  Weight: 76.4 kg (168 lb 6.9 oz)    Objective: Physical Exam: Filed Vitals:   09/15/15 0910 09/15/15 1100 09/15/15 1314 09/15/15 1530  BP: 172/76 176/68 170/73 191/74  Pulse:   58 57  Temp: 98.4 F (36.9 C) 98.2 F (36.8 C) 98.3 F (36.8 C) 98.2 F (36.8 C)  TempSrc: Oral Oral Oral Oral  Resp: Weight:      SpO2: 98% 98% 96% 96%     General: Appear in marked distress, no Rash; Oral Mucosa moist. Cardiovascular: S1 and S2 Present, no Murmur Respiratory: Bilateral Air entry present and Clear to Auscultation, no Crackles, on wheezes Abdomen: Bowel Sound present, Soft and no tenderness Extremities: no Pedal edema, no calf tenderness Neurology: profound left sided weakness, not following commands, spontaneous movement of right extremities.   Data Reviewed: CBC:  Recent Labs Lab 09/14/15 1959 09/15/15 0531  WBC 7.8 7.6  NEUTROABS 5.5  --   HGB 11.4* 10.2*  HCT 34.9* 31.3*  MCV 97.5 98.1  PLT 243 215   Basic Metabolic Panel:  Recent Labs Lab 09/14/15 1959 09/15/15 0531  NA 138 139  K 4.4 3.9  CL 106 106  CO2 21* 22  GLUCOSE 129* 118*  BUN 19 16  CREATININE 1.29*  1.22*  CALCIUM 9.4 8.9   Liver Function Tests:  Recent Labs Lab 09/14/15 1959  AST 29  ALT 17  ALKPHOS 110  BILITOT 0.9  PROT 6.9  ALBUMIN 3.6   No results for input(s): LIPASE, AMYLASE in the last 168 hours. No results for input(s): AMMONIA in the last 168  hours.  Cardiac Enzymes: No results for input(s): CKTOTAL, CKMB, CKMBINDEX, TROPONINI in the last 168 hours.  BNP (last 3 results) No results for input(s): BNP in the last 8760 hours.  CBG:  Recent Labs Lab 09/15/15 1116 09/15/15 1623  GLUCAP 106* 100*    No results found for this or any previous visit (from the past 240 hour(s)).   Studies: Ct Angio Head W/cm &/or Wo Cm  09/15/2015  CLINICAL DATA:  82 year old hypertensive female with altered mental status post fall. Subsequent encounter. EXAM: CT ANGIOGRAPHY HEAD AND NECK TECHNIQUE: Multidetector CT imaging of the head and neck was performed using the standard protocol during bolus administration of intravenous contrast. Multiplanar CT image reconstructions and MIPs were obtained to evaluate the vascular anatomy. Carotid stenosis measurements (when applicable) are obtained utilizing NASCET criteria, using the distal internal carotid diameter as the denominator. CONTRAST:  50 cc Isovue 370. COMPARISON:  09/14/2015 head CT. FINDINGS: CT HEAD Brain: Subcutaneous hematoma left supraorbital region without skull fracture or intracranial hemorrhage. Moderate to large acute posterior right frontal -parietal lobe infarct without associated hemorrhage. Mild local mass effect. Remote large left posterior frontal -parietal lobe infarct with encephalomalacia and subsequent dilation left lateral ventricle. Global atrophy. Prominent small vessel disease changes. No intracranial enhancing lesion. Calvarium and skull base: Negative. Paranasal sinuses: Clear. Orbits: No acute abnormality.  Post lens replacement. CTA NECK Aortic arch: Plaque with 3 vessel arch. Plaque with mild to slightly moderate narrowing proximal left subclavian artery. Plaque with moderate narrowing proximal right subclavian artery. Right carotid system: Ectatic common carotid artery. Plaque carotid bifurcation extending into the internal carotid artery with 64% diameter stenosis proximal  right internal carotid artery. Left carotid system: Ectatic left common carotid artery. Plaque left carotid bifurcation extending into the left internal carotid artery. 42% diameter stenosis proximal left internal carotid artery. Vertebral arteries:Plaque with mild narrowing proximal left vertebral artery. Left vertebral artery is dominant. Mild narrowing origin of the right vertebral artery. Skeleton: Cervical spondylotic changes with kyphosis centered at the C5 level. Various degrees of spinal stenosis and foraminal narrowing. No obvious fracture. Other neck: Scarring lung apices without mass noted. Top-normal slightly prominent size upper mediastinal lymph nodes. No worrisome neck mass. CTA HEAD Anterior circulation: Moderate narrowing M1 segment middle cerebral artery greater on the right. Decrease number of visualized middle cerebral artery branches bilaterally with a moderate narrowing and irregularity of visualize branches. Cavernous segment internal carotid artery calcified plaque with mild narrowing bilaterally. Fetal type contribution to the left posterior cerebral artery. Posterior circulation: Right vertebral artery ends in a posterior inferior cerebellar artery distribution. Moderate narrowing and irregularity of the visualized aspect of the right vertebral artery. Calcified plaque left internal carotid artery with mild narrowing. Plaque with mild narrowing basilar artery. Mild to moderate narrowing of portions of the posterior cerebral artery bilaterally. Venous sinuses: Patent. Anatomic variants: As above. Delayed phase: As above. IMPRESSION: CT HEAD Subcutaneous hematoma left supraorbital region without skull fracture or intracranial hemorrhage. Moderate to large acute posterior right frontal -parietal lobe infarct without associated hemorrhage. Mild local mass effect. Remote large left posterior frontal -parietal lobe infarct with encephalomalacia and subsequent dilation left lateral  ventricle.  Global atrophy. Prominent small vessel disease changes. CTA NECK Mild to slightly moderate narrowing proximal left subclavian artery. Mild narrowing proximal right subclavian artery. Ectatic common carotid arteries. 64% diameter stenosis proximal right internal carotid artery. 42% diameter stenosis proximal left internal carotid artery. Mild narrowing proximal left vertebral artery. Left vertebral artery is dominant. Mild narrowing origin of the right vertebral artery. Cervical spondylotic changes with kyphosis centered at the C5 level. Various degrees of spinal stenosis and foraminal narrowing. No obvious fracture. Scarring lung apices without mass noted. Top-normal to slightly prominent size upper mediastinal lymph nodes. CTA HEAD Moderate narrowing M1 segment middle cerebral artery greater on the right. Decrease number of visualized middle cerebral artery branches bilaterally with moderate narrowing and irregularity of visualized branches. Cavernous segment internal carotid artery calcified plaque with mild narrowing bilaterally. Fetal type contribution to the left posterior cerebral artery. Right vertebral artery ends in a posterior inferior cerebellar artery distribution. Moderate narrowing and irregularity of the visualized aspect of the right vertebral artery. Calcified plaque left internal carotid artery with mild narrowing. Plaque with mild narrowing basilar artery. Mild to moderate narrowing of portions of the posterior cerebral artery bilaterally. Electronically Signed   By: Lacy Duverney M.D.   On: 09/15/2015 12:42   Ct Head Wo Contrast  09/14/2015  CLINICAL DATA:  Altered mental status with apparent fall EXAM: CT HEAD WITHOUT CONTRAST TECHNIQUE: Contiguous axial images were obtained from the base of the skull through the vertex without intravenous contrast. COMPARISON:  None. FINDINGS: There is moderate diffuse atrophy. There is no intracranial mass, hemorrhage, extra-axial fluid collection, or  midline shift. There is evidence of prior infarcts involving the posterior temporal and anterior to mid left occipital lobes as well as in the mid left frontal lobe. There is small vessel disease throughout the centra semiovale bilaterally. There is evidence of a prior small infarct in the posterior medial right occipital lobe. There is an acute appearing infarct in the right parietal lobe with cytotoxic edema in this area. No other acute infarct is evident. The bony calvarium appears intact. There is a left frontal scalp hematoma. The mastoid air cells are clear. Orbits appear symmetric bilaterally. IMPRESSION: Acute appearing infarct right parietal lobe with cytotoxic edema. Old infarcts involving the left frontal lobe, posterior left temporal lobe, and much of the left occipital lobe. A small prior infarct in the medial right occipital lobe is also evident. There is moderate small vessel disease throughout the centra semiovale bilaterally. No intracranial hemorrhage. No extra-axial fluid collections. There is a left frontal scalp hematoma. No fracture evident. Electronically Signed   By: Bretta Bang III M.D.   On: 09/14/2015 20:45   Ct Angio Neck W/cm &/or Wo/cm  09/15/2015  CLINICAL DATA:  82 year old hypertensive female with altered mental status post fall. Subsequent encounter. EXAM: CT ANGIOGRAPHY HEAD AND NECK TECHNIQUE: Multidetector CT imaging of the head and neck was performed using the standard protocol during bolus administration of intravenous contrast. Multiplanar CT image reconstructions and MIPs were obtained to evaluate the vascular anatomy. Carotid stenosis measurements (when applicable) are obtained utilizing NASCET criteria, using the distal internal carotid diameter as the denominator. CONTRAST:  50 cc Isovue 370. COMPARISON:  09/14/2015 head CT. FINDINGS: CT HEAD Brain: Subcutaneous hematoma left supraorbital region without skull fracture or intracranial hemorrhage. Moderate to large  acute posterior right frontal -parietal lobe infarct without associated hemorrhage. Mild local mass effect. Remote large left posterior frontal -parietal lobe infarct with encephalomalacia and subsequent dilation  left lateral ventricle. Global atrophy. Prominent small vessel disease changes. No intracranial enhancing lesion. Calvarium and skull base: Negative. Paranasal sinuses: Clear. Orbits: No acute abnormality.  Post lens replacement. CTA NECK Aortic arch: Plaque with 3 vessel arch. Plaque with mild to slightly moderate narrowing proximal left subclavian artery. Plaque with moderate narrowing proximal right subclavian artery. Right carotid system: Ectatic common carotid artery. Plaque carotid bifurcation extending into the internal carotid artery with 64% diameter stenosis proximal right internal carotid artery. Left carotid system: Ectatic left common carotid artery. Plaque left carotid bifurcation extending into the left internal carotid artery. 42% diameter stenosis proximal left internal carotid artery. Vertebral arteries:Plaque with mild narrowing proximal left vertebral artery. Left vertebral artery is dominant. Mild narrowing origin of the right vertebral artery. Skeleton: Cervical spondylotic changes with kyphosis centered at the C5 level. Various degrees of spinal stenosis and foraminal narrowing. No obvious fracture. Other neck: Scarring lung apices without mass noted. Top-normal slightly prominent size upper mediastinal lymph nodes. No worrisome neck mass. CTA HEAD Anterior circulation: Moderate narrowing M1 segment middle cerebral artery greater on the right. Decrease number of visualized middle cerebral artery branches bilaterally with a moderate narrowing and irregularity of visualize branches. Cavernous segment internal carotid artery calcified plaque with mild narrowing bilaterally. Fetal type contribution to the left posterior cerebral artery. Posterior circulation: Right vertebral artery ends  in a posterior inferior cerebellar artery distribution. Moderate narrowing and irregularity of the visualized aspect of the right vertebral artery. Calcified plaque left internal carotid artery with mild narrowing. Plaque with mild narrowing basilar artery. Mild to moderate narrowing of portions of the posterior cerebral artery bilaterally. Venous sinuses: Patent. Anatomic variants: As above. Delayed phase: As above. IMPRESSION: CT HEAD Subcutaneous hematoma left supraorbital region without skull fracture or intracranial hemorrhage. Moderate to large acute posterior right frontal -parietal lobe infarct without associated hemorrhage. Mild local mass effect. Remote large left posterior frontal -parietal lobe infarct with encephalomalacia and subsequent dilation left lateral ventricle. Global atrophy. Prominent small vessel disease changes. CTA NECK Mild to slightly moderate narrowing proximal left subclavian artery. Mild narrowing proximal right subclavian artery. Ectatic common carotid arteries. 64% diameter stenosis proximal right internal carotid artery. 42% diameter stenosis proximal left internal carotid artery. Mild narrowing proximal left vertebral artery. Left vertebral artery is dominant. Mild narrowing origin of the right vertebral artery. Cervical spondylotic changes with kyphosis centered at the C5 level. Various degrees of spinal stenosis and foraminal narrowing. No obvious fracture. Scarring lung apices without mass noted. Top-normal to slightly prominent size upper mediastinal lymph nodes. CTA HEAD Moderate narrowing M1 segment middle cerebral artery greater on the right. Decrease number of visualized middle cerebral artery branches bilaterally with moderate narrowing and irregularity of visualized branches. Cavernous segment internal carotid artery calcified plaque with mild narrowing bilaterally. Fetal type contribution to the left posterior cerebral artery. Right vertebral artery ends in a posterior  inferior cerebellar artery distribution. Moderate narrowing and irregularity of the visualized aspect of the right vertebral artery. Calcified plaque left internal carotid artery with mild narrowing. Plaque with mild narrowing basilar artery. Mild to moderate narrowing of portions of the posterior cerebral artery bilaterally. Electronically Signed   By: Lacy Duverney M.D.   On: 09/15/2015 12:42   Dg Chest Port 1 View  09/14/2015  CLINICAL DATA:  Fall EXAM: PORTABLE CHEST 1 VIEW COMPARISON:  None. FINDINGS: Lungs are clear. Heart is enlarged with pulmonary vascularity within normal limits. No adenopathy. There is a skin fold on the right but  no pneumothorax. No fractures are evident. There is degenerative change in the shoulders bilaterally. Atherosclerotic calcification is noted in the aortic arch region. IMPRESSION: No edema or consolidation. There is cardiomegaly. There is a skin fold on the right but no apparent pneumothorax. Electronically Signed   By: Bretta BangWilliam  Woodruff III M.D.   On: 09/14/2015 19:34     Scheduled Meds: . aspirin  300 mg Rectal Daily   Or  . aspirin  325 mg Oral Daily  . pantoprazole (PROTONIX) IV  40 mg Intravenous QHS   Continuous Infusions: . sodium chloride 50 mL/hr at 09/15/15 1643   PRN Meds: acetaminophen **OR** acetaminophen  Time spent: 30 minutes  Author: Lynden OxfordPranav Carlin Attridge, MD [facility] Pager: [phone] 09/15/2015 4:53 PM  If 7PM-7AM, please contact night-coverage at [email or web address], password [email or web address]

## 2015-09-15 NOTE — Progress Notes (Signed)
STROKE TEAM PROGRESS NOTE   HISTORY OF PRESENT ILLNESS Alexis Madden is an 82 y.o. female with a history of hypertension and hyperlipidemia as well as previous left cerebral infarctions with residual aphasia, brought to the emergency room after being found on the floor of the home [place] and. She was last seen well at about 1:30 PM on 09/14/2015. She was noted to have ecchymosis and swelling involving the left frontal and periorbital region. She was stuporous and has remained stuporous. CT scan of her head showed an acute right parietal area of hypoattenuation, likely acute infarction, in addition to previous left cerebral infarctions. Patient has been taking aspirin and Plavix daily. Patient was not administered IV t-PA secondary to large acute cerebral infarction demonstrated on CT scan; beyond time window for treatment consideration. She was admitted to the neuro ICU for further evaluation and treatment.   SUBJECTIVE (INTERVAL HISTORY) Her daughter and son are at the bedside.  Overall she feels her condition is stable. She was sleepy but able to arouse. Has global aphasia and left UE weakness. She does not have advanced directives. She had first stroke about 2 years ago, rapid recovery without residue. The 2nd stroke one year ago left her with expressive aphasia but still live independently without motor deficit. This time she was found down on the floor with mental status change and left UE weakness   OBJECTIVE Temp:  [97.6 F (36.4 C)-98.6 F (37 C)] 98.2 F (36.8 C) (04/13 1530) Pulse Rate:  [57-79] 57 (04/13 1530) Cardiac Rhythm:  [-] Normal sinus rhythm;Bundle branch block (04/13 0700) Resp:  [13-18] 16 (04/13 1530) BP: (121-196)/(56-101) 191/74 mmHg (04/13 1530) SpO2:  [93 %-98 %] 96 % (04/13 1530) Weight:  [168 lb 6.9 oz (76.4 kg)] 168 lb 6.9 oz (76.4 kg) (04/12 2258)  CBC:   Recent Labs Lab 09/14/15 1959 09/15/15 0531  WBC 7.8 7.6  NEUTROABS 5.5  --   HGB 11.4* 10.2*  HCT  34.9* 31.3*  MCV 97.5 98.1  PLT 243 215    Basic Metabolic Panel:   Recent Labs Lab 09/14/15 1959 09/15/15 0531  NA 138 139  K 4.4 3.9  CL 106 106  CO2 21* 22  GLUCOSE 129* 118*  BUN 19 16  CREATININE 1.29* 1.22*  CALCIUM 9.4 8.9    Lipid Panel:     Component Value Date/Time   CHOL 138 09/15/2015 0531   TRIG 73 09/15/2015 0531   HDL 56 09/15/2015 0531   CHOLHDL 2.5 09/15/2015 0531   VLDL 15 09/15/2015 0531   LDLCALC 67 09/15/2015 0531   HgbA1c: No results found for: HGBA1C Urine Drug Screen: No results found for: LABOPIA, COCAINSCRNUR, LABBENZ, AMPHETMU, THCU, LABBARB    IMAGING I have personally reviewed the radiological images below and agree with the radiology interpretations.  Ct Angio Head and neck W/cm &/or Wo Cm  09/15/2015  IMPRESSION: CT HEAD Subcutaneous hematoma left supraorbital region without skull fracture or intracranial hemorrhage. Moderate to large acute posterior right frontal -parietal lobe infarct without associated hemorrhage. Mild local mass effect. Remote large left posterior frontal -parietal lobe infarct with encephalomalacia and subsequent dilation left lateral ventricle. Global atrophy. Prominent small vessel disease changes. CTA NECK Mild to slightly moderate narrowing proximal left subclavian artery. Mild narrowing proximal right subclavian artery. Ectatic common carotid arteries. 64% diameter stenosis proximal right internal carotid artery. 42% diameter stenosis proximal left internal carotid artery. Mild narrowing proximal left vertebral artery. Left vertebral artery is dominant. Mild narrowing origin of  the right vertebral artery. Cervical spondylotic changes with kyphosis centered at the C5 level. Various degrees of spinal stenosis and foraminal narrowing. No obvious fracture. Scarring lung apices without mass noted. Top-normal to slightly prominent size upper mediastinal lymph nodes. CTA HEAD Moderate narrowing M1 segment middle cerebral artery  greater on the right. Decrease number of visualized middle cerebral artery branches bilaterally with moderate narrowing and irregularity of visualized branches. Cavernous segment internal carotid artery calcified plaque with mild narrowing bilaterally. Fetal type contribution to the left posterior cerebral artery. Right vertebral artery ends in a posterior inferior cerebellar artery distribution. Moderate narrowing and irregularity of the visualized aspect of the right vertebral artery. Calcified plaque left internal carotid artery with mild narrowing. Plaque with mild narrowing basilar artery. Mild to moderate narrowing of portions of the posterior cerebral artery bilaterally.   Ct Head Wo Contrast  09/14/2015  IMPRESSION: Acute appearing infarct right parietal lobe with cytotoxic edema. Old infarcts involving the left frontal lobe, posterior left temporal lobe, and much of the left occipital lobe. A small prior infarct in the medial right occipital lobe is also evident. There is moderate small vessel disease throughout the centra semiovale bilaterally. No intracranial hemorrhage. No extra-axial fluid collections. There is a left frontal scalp hematoma. No fracture evident.    Dg Chest Port 1 View  09/14/2015  IMPRESSION: No edema or consolidation. There is cardiomegaly. There is a skin fold on the right but no apparent pneumothorax.    TTE - pending  LE venous doppler - pending   PHYSICAL EXAM  Temp:  [97.6 F (36.4 C)-98.6 F (37 C)] 98.2 F (36.8 C) (04/13 1530) Pulse Rate:  [57-79] 57 (04/13 1530) Resp:  [13-18] 16 (04/13 1530) BP: (121-196)/(56-101) 191/74 mmHg (04/13 1530) SpO2:  [93 %-98 %] 96 % (04/13 1530) Weight:  [168 lb 6.9 oz (76.4 kg)] 168 lb 6.9 oz (76.4 kg) (04/12 2258)  General - Well nourished, well developed, sleepy but arousable.  Ophthalmologic - Fundi not visualized due to noncooperation.  Cardiovascular - Regular rate and rhythm.  Neuro - sleepy but arousable,  global aphasia, not following commands, respond to every questions with "No". PERRL, no gaze deviation, no neglect, eyes attending to both directions, however, not blinking to visual threat bilaterally. No facial asymmetry, tongue in middle. RUE and RLE as well as LLE spontaneous movement against gravity, LUE flaccid, 2/5 on pain stimulation. DTR 1+ and no babinski. Not cooperative on exam for sensory, coordination or gait.    ASSESSMENT/PLAN Ms. Alexis Madden is a 82 y.o. female with history of previous L MCA stroke with residual expressive aphasia, hypertension and hyperlipidemia found down at home. CT showed likely acute R parietal infarct.. She did not receive IV t-PA due to beyond time window for treatment consideration.   Stroke: right MCA acute infarct, embolic secondary to unknown source. Pt also has hx of left MCA infarct, embolic pattern. Pt has dual antiplatelet and statin at home, without significant stroke risk factors, concerning for afib. However, pt previously refused cardiac monitoring in the past.  Resultant  Global aphasia, LUE weakness  MRI  Not able to tolerate  CTA head and neck - diffuse athero, no significant stenosis or occlusion  2D Echo  pending  LDL 67  HgbA1c pending  Lovenox for VTE prophylaxis Diet NPO time specified  aspirin 81 mg daily and clopidogrel 75 mg daily prior to admission, now on aspirin 300 mg suppository daily. May consider resume home meds after po access.  Patient counseled to be compliant with her antithrombotic medications  Ongoing aggressive stroke risk factor management  Therapy recommendations:  pending  Disposition:  Pending  Hx of stroke  1st 2 years ago - recovered quickly  2nd one year ago - with residue expressive aphasia  Refused cardiac monitoring at that time  Living independently  Hypertension  Fluctuating   Permissive hypertension (OK if < 220/120) but gradually normalize in 5-7 days  Hyperlipidemia  Home  meds:  mevacor 40 mg daily  LDL 67, goal < 70  Resume mevacor once po access  Continue statin at discharge  Other Stroke Risk Factors  Advanced age  Hx stroke/TIA  Family hx stroke (MOTHER)  Other Active Problems  Elevated creatinine - 1.22  Anemia hb 10.2  Hospital day # 1  Marvel Plan, MD PhD Stroke Neurology 09/15/2015 5:10 PM    To contact Stroke Continuity provider, please refer to [email or web address]. After hours, contact General Neurology

## 2015-09-15 NOTE — Care Management Note (Signed)
Case Management Note  Patient Details  Name: Clarene ReamerMinnie Mcpartland MRN: [id number] Date of Birth: 01-Nov-1925  Subjective/Objective:                    Action/Plan: Patient presented with altered mental status, fall.  Lives at home alone, but has family checking on her daily.  Will follow for discharge needs pending PT/OT evals and physician orders.  Expected Discharge Date:                  Expected Discharge Plan:     In-House Referral:     Discharge planning Services     Post Acute Care Choice:    Choice offered to:     DME Arranged:    DME Agency:     HH Arranged:    HH Agency:     Status of Service:  In process, will continue to follow  Medicare Important Message Given:    Date Medicare IM Given:    Medicare IM give by:    Date Additional Medicare IM Given:    Additional Medicare Important Message give by:     If discussed at Long Length of Stay Meetings, dates discussed:    Additional Comments:  Anda KraftRobarge, Terralyn Matsumura C, RN 09/15/2015, 10:45 AM [phone]

## 2015-09-15 NOTE — Evaluation (Signed)
Physical Therapy Evaluation Patient Details Name: Alexis Madden MRN: [id number] DOB: 05/14/1926 Today's Date: 09/15/2015   History of Present Illness  82 y.o. female with h/o CVA x2 with residual dysarthria/aphasia and HTN who presented to ED with s/s of CVA (per son was found lying on floor). Per MD note, pt had last stroke 1 year ago and family has been unable to understand her since. CT Head 4/12 acte appearing infarct R parietal lobe with cytotoxic edema. Old infarct involving the L frontal lobe, posterior L temporal lobe and much of L occipital lobe. Small prior infarct in medial R occipital lobe also evident    Clinical Impression  Pt admitted with above diagnosis. Pt with severely impaired communication which can slow progress with safe mobility. Receptive problems are new per son. Pt currently with functional limitations due to the deficits listed below (see PT Problem List).  Pt will benefit from skilled PT to increase their independence and safety with mobility to allow discharge to the venue listed below.       Follow Up Recommendations SNF;Supervision/Assistance - 24 hour    Equipment Recommendations  None recommended by PT    Recommendations for Other Services       Precautions / Restrictions Precautions Precautions: Fall Precaution Comments: pt denies h/o falls prior to this CVA      Mobility  Bed Mobility Overal bed mobility: Needs Assistance Bed Mobility: Rolling;Sidelying to Sit;Sit to Sidelying Rolling: Min guard Sidelying to sit: Min assist (exit to Rt)     Sit to sidelying: Mod assist General bed mobility comments: visual cues; guiding torso and assist lifting legs  Transfers Overall transfer level: Needs assistance Equipment used: None Transfers: Sit to/from Stand;Stand Pivot Transfers Sit to Stand: Min assist;+2 safety/equipment Stand pivot transfers: Mod assist;+2 physical assistance;+2 safety/equipment       General transfer comment: intial  stand-pivot to her left to [facility]; on return to the bed (to her rt) pt appeared confused and required 2 person assist to pivot (was bearing weight)  Ambulation/Gait Ambulation/Gait assistance: Mod assist;+2 physical assistance;+2 safety/equipment Ambulation Distance (Feet): 2 Feet Assistive device: 2 person hand held assist Gait Pattern/deviations: Step-to pattern;Decreased stride length;Shuffle     General Gait Details: pivotal steps to/from [facility]  Stairs            Wheelchair Mobility    Modified Rankin (Stroke Patients Only) Modified Rankin (Stroke Patients Only) Pre-Morbid Rankin Score: Moderate disability Modified Rankin: Moderately severe disability     Balance Overall balance assessment: Needs assistance Sitting-balance support: No upper extremity supported;Feet supported Sitting balance-Leahy Scale: Fair Sitting balance - Comments: closeguarding at EOB   Standing balance support: Single extremity supported Standing balance-Leahy Scale: Poor                               Pertinent Vitals/Pain Pain Assessment: Faces Faces Pain Scale: Hurts whole lot Pain Location: LUE, ? shoulder Pain Descriptors / Indicators: Grimacing Pain Intervention(s): Limited activity within patient's tolerance;Monitored during session;Repositioned;Other (comment) (notified RN; no xrays done on admission)    Home Living Family/patient expects to be discharged to:: Unsure Living Arrangements: Alone               Additional Comments: Son reports he cannot bathe or toilet his mother; she would need to be independent with those tasks    Prior Function Level of Independence: Needs assistance   Gait / Transfers Assistance Needed: walked with ?quad  cane  ADL's / Homemaking Assistance Needed: independent ADLs; did not cook (family provided meals)        Hand Dominance   Dominant Hand: Right    Extremity/Trunk Assessment   Upper Extremity Assessment: Defer to OT  evaluation           Lower Extremity Assessment: LLE deficits/detail   LLE Deficits / Details: at least 3+ to 4/5 with no buckling in weight-bearing  Cervical / Trunk Assessment: Kyphotic  Communication   Communication: Receptive difficulties;Expressive difficulties  Cognition Arousal/Alertness: Awake/alert (once awakened) Behavior During Therapy: Restless Overall Cognitive Status: Difficult to assess                      General Comments General comments (skin integrity, edema, etc.): Son present at beginning and end of session.     Exercises        Assessment/Plan    PT Assessment Patient needs continued PT services  PT Diagnosis Difficulty walking;Hemiplegia non-dominant side   PT Problem List Decreased strength;Decreased activity tolerance;Decreased balance;Decreased mobility;Decreased knowledge of use of DME;Decreased safety awareness;Decreased knowledge of precautions  PT Treatment Interventions DME instruction;Gait training;Functional mobility training;Therapeutic activities;Balance training;Neuromuscular re-education;Patient/family education   PT Goals (Current goals can be found in the Care Plan section) Acute Rehab PT Goals Patient Stated Goal: pt unable to state; son wants her to be independent in her home PT Goal Formulation: With family Time For Goal Achievement: 09/29/15 Potential to Achieve Goals: Good    Frequency Min 3X/week   Barriers to discharge Decreased caregiver support      Co-evaluation PT/OT/SLP Co-Evaluation/Treatment: Yes Reason for Co-Treatment: Complexity of the patient's impairments (multi-system involvement);For patient/therapist safety (receptive aphasia) PT goals addressed during session: Mobility/safety with mobility;Balance         End of Session Equipment Utilized During Treatment: Gait belt Activity Tolerance: Patient limited by fatigue Patient left: in bed;with call bell/phone within reach;with bed alarm set;with  SCD's reapplied Nurse Communication: Mobility status;Other (comment) (painful LUE)         Time: 1610-96041440-1509 PT Time Calculation (min) (ACUTE ONLY): 29 min   Charges:   PT Evaluation $PT Eval Moderate Complexity: 1 Procedure     PT G Codes:        Alexis Madden 09/15/2015, 3:41 PM Pager [phone]

## 2015-09-15 NOTE — Evaluation (Signed)
Occupational Therapy Evaluation Patient Details Name: Alexis Madden MRN: [id number] DOB: 01/17/1926 Today's Date: 09/15/2015    History of Present Illness 82 y.o. female with h/o CVA x2 with residual dysarthria/aphasia and HTN who presented to ED with s/s of CVA (per son was found lying on floor). Per MD note, pt had last stroke 1 year ago and family has been unable to understand her since. CT Head 4/12 acte appearing infarct R parietal lobe with cytotoxic edema. Old infarct involving the L frontal lobe, posterior L temporal lobe and much of L occipital lobe. Small prior infarct in medial R occipital lobe also evident   Clinical Impression   Pt unable to verbalize PLOF, therefore, information obtained from pts son. Son reports that pt was living alone and was independent with BADLs PTA; family provided meals. Currently pt mod assist +2 for stand pivot transfer to [facility], max assist overall for ADLs. Pt noted to have decreased AROM/strength in LUE limiting her independence and safety with ADLs and functional mobility; pt grimacing in pain with shoulder PROM (RN notified). Recommending SNF for follow up in order to maximize independence and safety with ADLs and functional mobility prior to return home. Pt would benefit from continued skilled OT to address established goals.     Follow Up Recommendations  SNF;Supervision/Assistance - 24 hour    Equipment Recommendations  Other (comment) (TBD)    Recommendations for Other Services       Precautions / Restrictions Precautions Precautions: Fall Precaution Comments: pt denies h/o falls prior to this CVA Restrictions Weight Bearing Restrictions: No      Mobility Bed Mobility Overal bed mobility: Needs Assistance Bed Mobility: Rolling;Sidelying to Sit;Sit to Sidelying Rolling: Min guard Sidelying to sit: Min assist (exit to Rt)     Sit to sidelying: Mod assist General bed mobility comments: visual cues; guiding torso and assist lifting  legs  Transfers Overall transfer level: Needs assistance Equipment used: None Transfers: Sit to/from Stand;Stand Pivot Transfers Sit to Stand: Min assist;+2 safety/equipment Stand pivot transfers: Mod assist;+2 physical assistance;+2 safety/equipment       General transfer comment: intial stand-pivot to her left to [facility]; on return to the bed (to her rt) pt appeared confused and required 2 person assist to pivot (was bearing weight)    Balance Overall balance assessment: Needs assistance Sitting-balance support: No upper extremity supported;Feet supported Sitting balance-Leahy Scale: Fair Sitting balance - Comments: closeguarding at EOB   Standing balance support: Single extremity supported Standing balance-Leahy Scale: Poor                              ADL Overall ADL's : Needs assistance/impaired     Grooming: Set up;Min guard;Wash/dry face;Sitting;Cueing for sequencing Grooming Details (indicate cue type and reason): Min assist for balance sitting unsupported.     Lower Body Bathing: Maximal assistance;Sit to/from stand;+2 for physical assistance   Upper Body Dressing : Maximal assistance;Sitting Upper Body Dressing Details (indicate cue type and reason): to doff/don hospital gown Lower Body Dressing: Maximal assistance Lower Body Dressing Details (indicate cue type and reason): to doff/don socks. pt able to kick feet up  Toilet Transfer: Moderate assistance;+2 for physical assistance;Stand-pivot;BSC   Toileting- Clothing Manipulation and Hygiene: +2 for physical assistance;Total assistance       Functional mobility during ADLs: Moderate assistance;+2 for physical assistance (for stand pivot) General ADL Comments: Pt with decreased ROM/strength/awareness of LUE; son questioning if she may have hurt  it during the fall (RN notified). Educated family on positioning of LUE.     Vision     Perception     Praxis      Pertinent Vitals/Pain Pain Assessment:  Faces Faces Pain Scale: Hurts whole lot Pain Location: LUE, ?shoulder Pain Descriptors / Indicators: Grimacing Pain Intervention(s): Limited activity within patient's tolerance;Monitored during session;Repositioned;Other (comment) (notified RN)     Hand Dominance Right   Extremity/Trunk Assessment Upper Extremity Assessment Upper Extremity Assessment: LUE deficits/detail LUE Deficits / Details: Strength overall 2-/5. PROM WFL but pt noted to be grimacing with shoulder ROM. Difficult to assess secondary to cognitive deficits. LUE Coordination: decreased fine motor;decreased gross motor   Lower Extremity Assessment Lower Extremity Assessment: Defer to PT evaluation    Cervical / Trunk Assessment Cervical / Trunk Assessment: Kyphotic   Communication Communication Communication: Receptive difficulties;Expressive difficulties   Cognition Arousal/Alertness: Awake/alert (once awakened) Behavior During Therapy: Restless Overall Cognitive Status: Difficult to assess                     General Comments       Exercises       Shoulder Instructions      Home Living Family/patient expects to be discharged to:: Unsure Living Arrangements: Alone                               Additional Comments: Son reports he cannot bathe or toilet his mother; she would need to be independent with those tasks      Prior Functioning/Environment Level of Independence: Needs assistance  Gait / Transfers Assistance Needed: walked with ?quad cane ADL's / Homemaking Assistance Needed: independent ADLs; did not cook (family provided meals) Communication / Swallowing Assistance Needed: expressive aphasia with decr intelligibility; receptively intact      OT Diagnosis: Generalized weakness;Cognitive deficits;Acute pain;Hemiplegia non-dominant side;Altered mental status   OT Problem List: Decreased strength;Decreased range of motion;Decreased activity tolerance;Impaired balance  (sitting and/or standing);Decreased coordination;Decreased cognition;Decreased safety awareness;Decreased knowledge of use of DME or AE;Decreased knowledge of precautions;Impaired tone;Impaired UE functional use;Pain   OT Treatment/Interventions: Self-care/ADL training;Therapeutic exercise;Energy conservation;DME and/or AE instruction;Neuromuscular education;Therapeutic activities;Cognitive remediation/compensation;Patient/family education;Balance training    OT Goals(Current goals can be found in the care plan section) Acute Rehab OT Goals Patient Stated Goal: pt unable to state; son wants her to be independent in her home OT Goal Formulation: With family Time For Goal Achievement: 09/29/15 Potential to Achieve Goals: Fair ADL Goals Pt Will Perform Grooming: with min guard assist;standing Pt Will Perform Upper Body Bathing: sitting;with supervision Pt Will Perform Lower Body Bathing: with min guard assist;sit to/from stand Pt Will Transfer to Toilet: with min guard assist;ambulating;bedside commode (over toilet) Pt Will Perform Toileting - Clothing Manipulation and hygiene: with min guard assist;sit to/from stand  OT Frequency: Min 2X/week   Barriers to D/C:            Co-evaluation PT/OT/SLP Co-Evaluation/Treatment: Yes Reason for Co-Treatment: Complexity of the patient's impairments (multi-system involvement);For patient/therapist safety;Necessary to address cognition/behavior during functional activity;Other (comment) (receptive aphasia) PT goals addressed during session: Mobility/safety with mobility;Balance OT goals addressed during session: ADL's and self-care;Other (comment) (mobility)      End of Session Equipment Utilized During Treatment: Gait belt Nurse Communication: Mobility status;Other (comment) (xray of L UE?)  Activity Tolerance: Patient limited by lethargy;Patient limited by pain Patient left: in bed;with call bell/phone within reach;with bed alarm set;with  family/visitor present  Time: 4782-95621440-1509 OT Time Calculation (min): 29 min Charges:  OT General Charges $OT Visit: 1 Procedure OT Evaluation $OT Eval Moderate Complexity: 1 Procedure G-Codes:     Gaye AlkenBailey A Dayson Aboud M.S., OTR/L Pager: [phone]  09/15/2015, 5:13 PM

## 2015-09-15 NOTE — Evaluation (Signed)
Speech Language Pathology Evaluation Patient Details Name: Alexis Madden MRN: [id number] DOB: 12-18-1925 Today's Date: 09/15/2015 Time: 4098-1191 SLP Time Calculation (min) (ACUTE ONLY): 9 min  Problem List:  Patient Active Problem List   Diagnosis Date Noted  . Infarction of parietal lobe (HCC) 09/14/2015  . History of stroke 09/14/2015  . Hypertension 09/14/2015  . Acute CVA (cerebrovascular accident) (HCC) 09/14/2015   Past Medical History:  Past Medical History  Diagnosis Date  . Stroke (HCC)   . Hypertension    Past Surgical History: History reviewed. No pertinent past surgical history. HPI:  82 y.o. female with h/o CVA x2 with residual dysarthria/aphasia and HTN who presented to ED with s/s of CVA. Per MD note, pt had last stroke 1 year ago and family has been unable to understand her since. CT Head 4/12 acte appearing infarct R parietal lobe with cytotoxic edema. Old infarct involving the L frontal lobe, posterior L temporal lobe and much of L occipital lobe. Small prior infarct in medial R occipital lobe also evident. CXR 4/12 no edema or consolidation. No prior SLP intervention found in chart.   Assessment / Plan / Recommendation Clinical Impression  SLP assessed pt's speech, language and cognition at bedside s/p stroke. Pt required max verbal, visual and tactile cues to attend during session due to somnolence. Pt able to state name, however could not respond during automatic speech naming task. Pt frequently vocalized throughout session, however <50% intelligible. Unable to follow 1-step commands and to respond consistently to basic biographical and environmental yes/no questions. Per MD note pt has baseline dysarthria/aphasia due to previous CVAs and family has been unable to communicate with pt for ~1 year. Unsure whether current presentation is baseline or due to acute infarct. SLP will continue to follow to provide aphasia, cognition and dysarthria tx acutely.    SLP  Assessment  Patient needs continued Speech Lanaguage Pathology Services    Follow Up Recommendations   (TBD)    Frequency and Duration min 2x/week  2 weeks      SLP Evaluation Prior Functioning  Cognitive/Linguistic Baseline: Baseline deficits Baseline deficit details: per MD note, residual dysarthria/aphasia from prior CVAs Type of Home: House  Lives With: Alone;Other (Comment) (family checks on her throughout the day)   Cognition  Overall Cognitive Status: Impaired/Different from baseline Arousal/Alertness: Lethargic Orientation Level: Oriented to person Attention: Focused;Sustained Focused Attention: Impaired Focused Attention Impairment: Verbal basic;Functional basic Sustained Attention: Impaired Sustained Attention Impairment: Verbal basic;Functional basic Behaviors: Restless;Poor frustration tolerance    Comprehension  Auditory Comprehension Overall Auditory Comprehension: Impaired Yes/No Questions: Impaired Basic Biographical Questions: 51-75% accurate Basic Immediate Environment Questions: 0-24% accurate Commands: Impaired Conversation: Simple Interfering Components: Attention;Pain Visual Recognition/Discrimination Discrimination: Not tested Reading Comprehension Reading Status: Not tested    Expression Expression Primary Mode of Expression: Verbal Verbal Expression Overall Verbal Expression: Impaired at baseline Initiation: No impairment Automatic Speech: Name;Counting Level of Generative/Spontaneous Verbalization: Word Pragmatics: No impairment Interfering Components: Attention;Speech intelligibility Written Expression Dominant Hand: Right Written Expression: Not tested   Oral / Motor  Oral Motor/Sensory Function Overall Oral Motor/Sensory Function: Within functional limits (limited due to inability to follow commands) Motor Speech Overall Motor Speech: Impaired Respiration: Within functional limits Phonation: Low vocal intensity Resonance:  Within functional limits Articulation: Impaired Level of Impairment: Word Intelligibility: Intelligibility reduced Word: 25-49% accurate Phrase: 0-24% accurate Sentence: Not tested Conversation: Not tested Motor Planning: Witnin functional limits Motor Speech Errors: Unaware   Lynita Lombard, Student-SLP  Lynita LombardLauren Xaidyn Kepner 09/15/2015, 9:59 AM

## 2015-09-16 ENCOUNTER — Inpatient Hospital Stay (HOSPITAL_COMMUNITY): Payer: Medicare Other

## 2015-09-16 DIAGNOSIS — R7989 Other specified abnormal findings of blood chemistry: Secondary | ICD-10-CM

## 2015-09-16 DIAGNOSIS — I4891 Unspecified atrial fibrillation: Secondary | ICD-10-CM

## 2015-09-16 DIAGNOSIS — R339 Retention of urine, unspecified: Secondary | ICD-10-CM

## 2015-09-16 DIAGNOSIS — I6789 Other cerebrovascular disease: Secondary | ICD-10-CM

## 2015-09-16 DIAGNOSIS — Z87898 Personal history of other specified conditions: Secondary | ICD-10-CM

## 2015-09-16 DIAGNOSIS — E46 Unspecified protein-calorie malnutrition: Secondary | ICD-10-CM

## 2015-09-16 DIAGNOSIS — Z4659 Encounter for fitting and adjustment of other gastrointestinal appliance and device: Secondary | ICD-10-CM | POA: Insufficient documentation

## 2015-09-16 DIAGNOSIS — R100 Acute abdomen: Secondary | ICD-10-CM

## 2015-09-16 DIAGNOSIS — R109 Unspecified abdominal pain: Secondary | ICD-10-CM | POA: Insufficient documentation

## 2015-09-16 DIAGNOSIS — I639 Cerebral infarction, unspecified: Secondary | ICD-10-CM

## 2015-09-16 LAB — BASIC METABOLIC PANEL
Anion gap: 16 — ABNORMAL HIGH (ref 5–15)
Anion gap: 13 (ref 5–15)
BUN: 11 mg/dL (ref 6–20)
BUN: 16 mg/dL (ref 6–20)
CALCIUM: 9.2 mg/dL (ref 8.9–10.3)
CALCIUM: 8.8 mg/dL — AB (ref 8.9–10.3)
CHLORIDE: 106 mmol/L (ref 101–111)
CO2: 22 mmol/L (ref 22–32)
CO2: 19 mmol/L — AB (ref 22–32)
CREATININE: 1.08 mg/dL — AB (ref 0.44–1.00)
Chloride: 101 mmol/L (ref 101–111)
Creatinine, Ser: 1.19 mg/dL — ABNORMAL HIGH (ref 0.44–1.00)
GFR calc non Af Amer: 40 mL/min — ABNORMAL LOW (ref >60)
GFR calc non Af Amer: 44 mL/min — ABNORMAL LOW (ref >60)
GFR, EST AFRICAN AMERICAN: 46 mL/min — AB (ref >60)
GFR, EST AFRICAN AMERICAN: 52 mL/min — AB (ref >60)
GLUCOSE: 130 mg/dL — AB (ref 65–99)
GLUCOSE: 159 mg/dL — AB (ref 65–99)
Potassium: 3.7 mmol/L (ref 3.5–5.1)
Potassium: 3.3 mmol/L — ABNORMAL LOW (ref 3.5–5.1)
Sodium: 139 mmol/L (ref 135–145)
Sodium: 138 mmol/L (ref 135–145)

## 2015-09-16 LAB — CBC
HEMATOCRIT: 36.1 % (ref 36.0–46.0)
HEMATOCRIT: 34.9 % — AB (ref 36.0–46.0)
HEMOGLOBIN: 11.6 g/dL — AB (ref 12.0–15.0)
Hemoglobin: 11.8 g/dL — ABNORMAL LOW (ref 12.0–15.0)
MCH: 31.9 pg (ref 26.0–34.0)
MCH: 32.1 pg (ref 26.0–34.0)
MCHC: 32.7 g/dL (ref 30.0–36.0)
MCHC: 33.2 g/dL (ref 30.0–36.0)
MCV: 97.6 fL (ref 78.0–100.0)
MCV: 96.7 fL (ref 78.0–100.0)
Platelets: 245 10*3/uL (ref 150–400)
Platelets: 246 10*3/uL (ref 150–400)
RBC: 3.7 MIL/uL — ABNORMAL LOW (ref 3.87–5.11)
RBC: 3.61 MIL/uL — ABNORMAL LOW (ref 3.87–5.11)
RDW: 13.6 % (ref 11.5–15.5)
RDW: 13.6 % (ref 11.5–15.5)
WBC: 8.3 10*3/uL (ref 4.0–10.5)
WBC: 9.8 10*3/uL (ref 4.0–10.5)

## 2015-09-16 LAB — GLUCOSE, CAPILLARY
GLUCOSE-CAPILLARY: 106 mg/dL — AB (ref 65–99)
GLUCOSE-CAPILLARY: 121 mg/dL — AB (ref 65–99)
GLUCOSE-CAPILLARY: 133 mg/dL — AB (ref 65–99)
GLUCOSE-CAPILLARY: 99 mg/dL (ref 65–99)
Glucose-Capillary: 107 mg/dL — ABNORMAL HIGH (ref 65–99)
Glucose-Capillary: 120 mg/dL — ABNORMAL HIGH (ref 65–99)

## 2015-09-16 LAB — LIPASE, BLOOD: LIPASE: 26 U/L (ref 11–51)

## 2015-09-16 LAB — PROTIME-INR
INR: 1.16 (ref 0.00–1.49)
Prothrombin Time: 15 seconds (ref 11.6–15.2)

## 2015-09-16 LAB — HEPATIC FUNCTION PANEL
ALBUMIN: 3.3 g/dL — AB (ref 3.5–5.0)
ALT: 28 U/L (ref 14–54)
AST: 72 U/L — ABNORMAL HIGH (ref 15–41)
Alkaline Phosphatase: 107 U/L (ref 38–126)
BILIRUBIN INDIRECT: 1 mg/dL — AB (ref 0.3–0.9)
Bilirubin, Direct: 0.2 mg/dL (ref 0.1–0.5)
Total Bilirubin: 1.2 mg/dL (ref 0.3–1.2)
Total Protein: 6.9 g/dL (ref 6.5–8.1)

## 2015-09-16 LAB — TROPONIN I: Troponin I: 0.05 ng/mL — ABNORMAL HIGH (ref <0.031)

## 2015-09-16 LAB — ECHOCARDIOGRAM COMPLETE: WEIGHTICAEL: 2694.9 [oz_av]

## 2015-09-16 LAB — HEMOGLOBIN A1C
Hgb A1c MFr Bld: 6.3 % — ABNORMAL HIGH (ref 4.8–5.6)
Mean Plasma Glucose: 134 mg/dL

## 2015-09-16 LAB — LACTIC ACID, PLASMA: Lactic Acid, Venous: 1.1 mmol/L (ref 0.5–2.0)

## 2015-09-16 MED ORDER — HYDRALAZINE HCL 20 MG/ML IJ SOLN
5.0000 mg | Freq: Once | INTRAMUSCULAR | Status: AC
  Administered 2015-09-16: 5 mg via INTRAVENOUS
  Filled 2015-09-16: qty 1

## 2015-09-16 MED ORDER — METOPROLOL TARTRATE 25 MG/10 ML ORAL SUSPENSION
25.0000 mg | Freq: Two times a day (BID) | ORAL | Status: DC
  Filled 2015-09-16: qty 10

## 2015-09-16 MED ORDER — JEVITY 1.2 CAL PO LIQD
1000.0000 mL | ORAL | Status: DC
  Administered 2015-09-16 – 2015-09-20 (×3): 1000 mL
  Filled 2015-09-16 (×4): qty 1000
  Filled 2015-09-16: qty 237
  Filled 2015-09-16 (×3): qty 1000

## 2015-09-16 MED ORDER — PRO-STAT SUGAR FREE PO LIQD
30.0000 mL | Freq: Every day | ORAL | Status: DC
  Administered 2015-09-16 – 2015-09-19 (×4): 30 mL
  Filled 2015-09-16 (×4): qty 30

## 2015-09-16 MED ORDER — DILTIAZEM HCL 25 MG/5ML IV SOLN
10.0000 mg | Freq: Once | INTRAVENOUS | Status: AC
  Administered 2015-09-16: 10 mg via INTRAVENOUS
  Filled 2015-09-16: qty 5

## 2015-09-16 MED ORDER — METOPROLOL TARTRATE 1 MG/ML IV SOLN
5.0000 mg | Freq: Once | INTRAVENOUS | Status: AC
  Administered 2015-09-16: 5 mg via INTRAVENOUS

## 2015-09-16 MED ORDER — METOPROLOL TARTRATE 25 MG/10 ML ORAL SUSPENSION
25.0000 mg | Freq: Four times a day (QID) | ORAL | Status: DC
  Administered 2015-09-17 – 2015-09-19 (×11): 25 mg
  Filled 2015-09-16 (×13): qty 10

## 2015-09-16 MED ORDER — METOPROLOL TARTRATE 1 MG/ML IV SOLN
INTRAVENOUS | Status: AC
  Filled 2015-09-16: qty 5

## 2015-09-16 MED ORDER — DILTIAZEM HCL 25 MG/5ML IV SOLN: 10.0000 mg | Freq: Once | INTRAVENOUS | Status: DC

## 2015-09-16 NOTE — Progress Notes (Addendum)
STROKE TEAM PROGRESS NOTE   HISTORY OF PRESENT ILLNESS Alexis Madden is an 82 y.o. female with a history of hypertension and hyperlipidemia as well as previous left cerebral infarctions with residual aphasia, brought to the emergency room after being found on the floor of the home [place] and. She was last seen well at about 1:30 PM. She was noted to have ecchymosis and swelling involving the left frontal and periorbital region. She was stuporous and has remained stuporous. CT scan of her head showed an acute right parietal area of hypoattenuation, likely acute infarction, in addition to previous left cerebral infarctions. Patient has been taking aspirin and Plavix daily. She was last known well 1:30 PM on 09/14/2015. Patient was not administered IV t-PA secondary to large acute cerebral infarction demonstrated on CT scan and beyond time window for treatment consideration.  She was admitted for further evaluation and treatment.   SUBJECTIVE (INTERVAL HISTORY) No family is at the bedside on initial round, but later I talked with son and daughter in law in room. Pt remains stuporous this morning but able to open eyes on voice. Did not pass swallow. NPO currently. Family OK with NG tube but against PEG at this time. They would like to give pt several days to see the progress before making further decision regarding care options, further stroke work up or anticoagulation consideration.   OBJECTIVE Temp:  [97.9 F (36.6 C)-98.4 F (36.9 C)] 97.9 F (36.6 C) (04/14 0528) Pulse Rate:  [57-90] 74 (04/14 0528) Cardiac Rhythm:  [-] Normal sinus rhythm (04/14 0700) Resp:  [16-18] 18 (04/14 0528) BP: (155-191)/(67-136) 181/81 mmHg (04/14 0528) SpO2:  [92 %-98 %] 95 % (04/14 0528)  CBC:  Recent Labs Lab 09/14/15 1959 09/15/15 0531 09/16/15 0529  WBC 7.8 7.6 8.3  NEUTROABS 5.5  --   --   HGB 11.4* 10.2* 11.8*  HCT 34.9* 31.3* 36.1  MCV 97.5 98.1 97.6  PLT 243 215 245    Basic Metabolic Panel:   Recent Labs Lab 09/15/15 0531 09/16/15 0529  NA 139 139  K 3.9 3.7  CL 106 101  CO2 22 22  GLUCOSE 118* 130*  BUN 16 11  CREATININE 1.22* 1.19*  CALCIUM 8.9 9.2    Lipid Panel:    Component Value Date/Time   CHOL 138 09/15/2015 0531   TRIG 73 09/15/2015 0531   HDL 56 09/15/2015 0531   CHOLHDL 2.5 09/15/2015 0531   VLDL 15 09/15/2015 0531   LDLCALC 67 09/15/2015 0531   HgbA1c:  Lab Results  Component Value Date   HGBA1C 6.3* 09/15/2015   Urine Drug Screen: No results found for: LABOPIA, COCAINSCRNUR, LABBENZ, AMPHETMU, THCU, LABBARB    IMAGING I have personally reviewed the radiological images below and agree with the radiology interpretations.  Ct Head Wo Contrast 09/14/2015  Acute appearing infarct right parietal lobe with cytotoxic edema. Old infarcts involving the left frontal lobe, posterior left temporal lobe, and much of the left occipital lobe. A small prior infarct in the medial right occipital lobe is also evident. There is moderate small vessel disease throughout the centra semiovale bilaterally. No intracranial hemorrhage. No extra-axial fluid collections. There is a left frontal scalp hematoma. No fracture evident.   CT HEAD  09/15/2015  Subcutaneous hematoma left supraorbital region without skull fracture or intracranial hemorrhage. Moderate to large acute posterior right frontal -parietal lobe infarct without associated hemorrhage. Mild local mass effect. Remote large left posterior frontal -parietal lobe infarct with encephalomalacia and subsequent dilation  left lateral ventricle. Global atrophy. Prominent small vessel disease changes.   CTA NECK  09/15/2015  Mild to slightly moderate narrowing proximal left subclavian artery. Mild narrowing proximal right subclavian artery. Ectatic common carotid arteries. 64% diameter stenosis proximal right internal carotid artery. 42% diameter stenosis proximal left internal carotid artery. Mild narrowing proximal left  vertebral artery. Left vertebral artery is dominant. Mild narrowing origin of the right vertebral artery. Cervical spondylotic changes with kyphosis centered at the C5 level. Various degrees of spinal stenosis and foraminal narrowing. No obvious fracture. Scarring lung apices without mass noted. Top-normal to slightly prominent size upper mediastinal lymph nodes.   CTA HEAD  09/15/2015  Moderate narrowing M1 segment middle cerebral artery greater on the right. Decrease number of visualized middle cerebral artery branches bilaterally with moderate narrowing and irregularity of visualized branches. Cavernous segment internal carotid artery calcified plaque with mild narrowing bilaterally. Fetal type contribution to the left posterior cerebral artery. Right vertebral artery ends in a posterior inferior cerebellar artery distribution. Moderate narrowing and irregularity of the visualized aspect of the right vertebral artery. Calcified plaque left internal carotid artery with mild narrowing. Plaque with mild narrowing basilar artery. Mild to moderate narrowing of portions of the posterior cerebral artery bilaterally.   Dg Chest Port 1 View 09/14/2015   No edema or consolidation. There is cardiomegaly. There is a skin fold on the right but no apparent pneumothorax.   2-D echocardiogram - Left ventricle: The cavity size was normal. Wall thickness was increased in a pattern of moderate LVH. Systolic function was normal. The estimated ejection fraction was in the range of 60% to 65%. Wall motion was normal; there were no regional wall motion abnormalities. Features are consistent with a pseudonormal left ventricular filling pattern, with concomitant abnormal relaxation and increased filling pressure (grade 2 diastolic dysfunction). - Aortic valve: There was mild regurgitation. - Mitral valve: Moderately calcified annulus. Mildly thickened leaflets . The findings are consistent with mild stenosis. There was mild  regurgitation. - Right atrium: The atrium was mildly dilated. - Tricuspid valve: There was moderate regurgitation. - Pulmonary arteries: Systolic pressure was moderately increased. PA peak pressure: 48 mm Hg (S).  LE venous doppler - negative for DVT. No obvious evidence of DVT or baker's cyst.   PHYSICAL EXAM  Temp:  [97.9 F (36.6 C)-98.4 F (36.9 C)] 98.1 F (36.7 C) (04/14 0830) Pulse Rate:  [57-90] 80 (04/14 0830) Resp:  [16-18] 18 (04/14 0830) BP: (155-191)/(67-136) 186/79 mmHg (04/14 0830) SpO2:  [92 %-97 %] 96 % (04/14 0830)  General - Well nourished, well developed, drowsy but arousable.  Ophthalmologic - Fundi not visualized due to noncooperation.  Cardiovascular - irregularly irregular heart rate and rhythm, but on tele concerning for MAT.  Neuro - drowsy but open eyes on voice, fell back to sleep without constant stimulation, global aphasia, not following commands, respond to pain stimulaton with "please". PERRL, no gaze deviation, no neglect, eyes attending to both directions, however, not blinking to visual threat bilaterally. No facial asymmetry, tongue in middle. RUE and RLE as well as LLE spontaneous movement against gravity, LUE flaccid, 3-/5 on pain stimulation. DTR 1+ and no babinski. Not cooperative on exam for sensory, coordination or gait.   ASSESSMENT/PLAN Ms. Clarene ReamerMinnie Calia is a 82 y.o. female with history of hypertension, hyperlipidemia and previous left brain infarct with residual aphasia found down at home. CT showed a right parietal infarct.  She did not receive IV t-PA due to large infarct demonstrated on  CT and beyond time window for treatment consideration.   Stroke:  Moderate to large right frontal-parietal infarct embolic secondary to unknown source, concerning for afib as tele showed MAT. Depends on pt improvement, will consider further stroke work up, cardiac monitoring or anticoagulation choice later.   Previous infarct felt to be embolic, patient  refused cardiac monitoring   Resultant  Left UE paresis, dysphagia, stuporous state  CT head  Large moderate to right frontal-parietal lobe infarct with cytotoxic edema, old left posterior frontal parietal lobe infarct  CTA head  Diffuse atherosclerosis  CTA neck  Bilateral ICA therosclerosis, no significant stenosis  2D Echo EF 60-65% No source of embolus   LE venous Dopplers - negative for DVT.  LDL  67   HgbA1c 6.3  Lovenox 40 mg sq daily for VTE prophylaxis  Diet NPO time specified  aspirin 81 mg daily and clopidogrel 75 mg daily prior to admission, now on aspirin 300 mg suppository daily. Family against PEG tube according to pt wishes, will hold off further stroke work up, cardiac monitoring and anticoagulation choice at this time. Revisit depends on pt improvement.   Ongoing aggressive stroke risk factor management  Therapy recommendations:  pending   Disposition:  pending ( living independently prior to admission )   Hx of stroke  1st 2 years ago - recovered quickly  2nd one year ago - with residue expressive aphasia  Refused cardiac monitoring at that time  Living independently  Hypertension  Stable today  Permissive hypertension (OK if < 220/120) but gradually normalize in 5-7 days  Hyperlipidemia  Home meds:  mevacor 40  LDL 67, goal < 70  Resume statin once able to swallow, depending on plan of care  Other Stroke Risk Factors  Advanced age  History of stroke, last TIA  Family hx stroke (mother)  Other Active Problems  Elevated creatinine  Anemia, hemoglobin 11.8  Hospital day # 2   I had long discussion with son today. He stated that pt does not want PEG tube and against nursing home but he is agreeable for NG tube for nutrition at this time. He would like to see if pt improves for the next several days to decide on the further plan of care. I agree and discussed with Dr. Allena Katz over the phone.  Marvel Plan, MD PhD Stroke  Neurology 09/16/2015 2:05 PM    To contact Stroke Continuity provider, please refer to [email or web address]. After hours, contact General Neurology

## 2015-09-16 NOTE — Progress Notes (Signed)
[facility] Progress Note  Patient: Alexis ReamerMinnie Madden ZOX:[id number]   PCP: No primary care provider on file. DOB: November 22, 1925   DOA: 09/14/2015   DOS: 09/16/2015   Date of Service: the patient was seen and examined on 09/16/2015  Subjective: The patient is progressively more lethargic. No acute events identified overnight Nutrition: Nothing by mouth at present  Brief hospital course: Patient was admitted on 09/14/2015, with complaint of fall, was found to have right parietal stroke. Currently further plan is further stroke workup and therapy.  Assessment and Plan: 1. Acute CVA (cerebrovascular accident) [facility]) The patient presented with a fall. CT scan on admission showed acute right parietal lobe infarct with cytotoxic edema. Repeat CT scan this morning shows moderate to large infarction on the right side with mild local mass effect. Also left frontal scalp hematoma.  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 Patient has known history of carotid artery disease and CT neck shows 64% right ICA stenosis of 42% left ICA stenosis. Speech therapy recommends to maintain the patient nothing by mouth at present due to inability to follow command. Physical therapy recommends SNF. Occupational therapy recommends SNF as well Currently on aspirin suppository 300 mg. once the patient is showing evidence of tolerating tube feeding she will be transitioned back to aspirin and Plavix as home regimen Monitor on telemetry. Echocardiogram shows EF of 60-65% with grade 2 diastolic dysfunction without any significant valvular disease. Neurology consult appreciated.  2. Essential hypertension. Currently allowing permissive hypertension in the setting of acute stroke. Monitor clinically.   3. Chronic kidney disease stage III. Renal functions appears stable will continue to closely monitor. Neurologic recommends to initiate IV fluids at present. We'll monitor  clinically.  4. Urinary retention. In and out cath 1. We will do bladder scan every 8 hours.  5. Acute encephalopathy. Most likely in the setting of acute CVA. Unsure whether the patient needs EEG. We'll follow neurology recommendation.   Activity: physical therapy SNF Bowel regimen: last BM 09/15/2015 DVT Prophylaxis: Lovenox after discussion with neurology Nutrition: npo Advance goals of care discussion: full code, prognosis is significantly guarded at present. Informed the family regarding this as well.  Procedures: Echocardiogram  Consultants: Neurology Antibiotics: Anti-infectives    None       Family Communication: family was present at bedside, at the time of interview. Her son is POA and Opportunity was given to ask question and all questions were answered satisfactorily.   Disposition:  Expected discharge date: 09/18/2015 Barriers to safe discharge: Further progression of stroke and workup   Intake/Output Summary (Last 24 hours) at 09/16/15 1757 Last data filed at 09/16/15 1435  Gross per 24 hour  Intake      0 ml  Output    850 ml  Net   -850 ml   Filed Weights   09/14/15 2258  Weight: 76.4 kg (168 lb 6.9 oz)    Objective: Physical Exam: Filed Vitals:   09/16/15 0830 09/16/15 1408 09/16/15 1542 09/16/15 1737  BP: 186/79 183/87 184/73 190/79  Pulse: 80 81 83 73  Temp: 98.1 F (36.7 C) 98.8 F (37.1 C) 97.9 F (36.6 C) 99.1 F (37.3 C)  TempSrc: Axillary Axillary Axillary Axillary  Resp: 18 20 18 16   Weight:      SpO2: 96% 95% 97% 95%     General: Appear in marked distress, no Rash; Oral Mucosa moist.More lethargic this morning Cardiovascular: S1 and S2 Present, no Murmur Respiratory: Bilateral Air entry  present and Clear to Auscultation, no Crackles, on wheezes Abdomen: Bowel Sound present, Soft and no tenderness Extremities: no Pedal edema, no calf tenderness Neurology: profound left sided weakness, not following commands, spontaneous  movement of right extremities.   Data Reviewed: CBC:  Recent Labs Lab 09/14/15 1959 09/15/15 0531 09/16/15 0529  WBC 7.8 7.6 8.3  NEUTROABS 5.5  --   --   HGB 11.4* 10.2* 11.8*  HCT 34.9* 31.3* 36.1  MCV 97.5 98.1 97.6  PLT 243 215 245   Basic Metabolic Panel:  Recent Labs Lab 09/14/15 1959 09/15/15 0531 09/16/15 0529  NA 138 139 139  K 4.4 3.9 3.7  CL 106 106 101  CO2 21* 22 22  GLUCOSE 129* 118* 130*  BUN CREATININE 1.29* 1.22* 1.19*  CALCIUM 9.4 8.9 9.2   Liver Function Tests:  Recent Labs Lab 09/14/15 1959 09/16/15 1423  AST 29 72*  ALT 17 28  ALKPHOS 110 107  BILITOT 0.9 1.2  PROT 6.9 6.9  ALBUMIN 3.6 3.3*    Recent Labs Lab 09/16/15 1423  LIPASE 26   No results for input(s): AMMONIA in the last 168 hours.  Cardiac Enzymes: No results for input(s): CKTOTAL, CKMB, CKMBINDEX, TROPONINI in the last 168 hours.  BNP (last 3 results) No results for input(s): BNP in the last 8760 hours.  CBG:  Recent Labs Lab 09/15/15 2149 09/16/15 0014 09/16/15 0401 09/16/15 1128 09/16/15 1645  GLUCAP 98 106* 121* 107* 99    No results found for this or any previous visit (from the past 240 hour(s)).   Studies: Dg Abd Portable 1v  09/16/2015  CLINICAL DATA:  Feeding tube placement. EXAM: PORTABLE ABDOMEN - 1 VIEW COMPARISON:  09/16/2015. FINDINGS: Feeding tube terminates in the descending duodenum. Bowel gas pattern is unremarkable. Lung bases are grossly clear. IMPRESSION: Feeding tube terminates in the descending duodenum. Electronically Signed   By: Leanna Battles M.D.   On: 09/16/2015 16:15   Dg Abd Portable 1v  09/16/2015  CLINICAL DATA:  One day history of abdominal pain EXAM: PORTABLE ABDOMEN - 1 VIEW COMPARISON:  None. FINDINGS: There is a mild amount of stool in the colon. There is no bowel dilatation or air-fluid level suggesting obstruction. No free air. There is upper lumbar dextroscoliosis with extensive lumbar arthropathy. Lung  bases are clear. Soft tissue prominence in the pelvis likely is due to a distended urinary bladder. IMPRESSION: Probable urinary bladder distention. No bowel obstruction or free air evident. Extensive scoliosis and arthropathy in the lumbar spine region. Electronically Signed   By: Bretta Bang III M.D.   On: 09/16/2015 14:25     Scheduled Meds: . aspirin  300 mg Rectal Daily   Or  . aspirin  325 mg Oral Daily  . enoxaparin (LOVENOX) injection  40 mg Subcutaneous Q24H  . feeding supplement (PRO-STAT SUGAR FREE 64)  30 mL Per Tube Daily  . pantoprazole (PROTONIX) IV  40 mg Intravenous QHS   Continuous Infusions: . sodium chloride 50 mL/hr at 09/15/15 1643  . feeding supplement (JEVITY 1.2 CAL)     PRN Meds: acetaminophen **OR** acetaminophen  Time spent: 30 minutes  Author: Lynden Oxford, MD [facility] Pager: [phone] 09/16/2015 5:57 PM  If 7PM-7AM, please contact night-coverage at [email or web address], password [facility]

## 2015-09-16 NOTE — Code Documentation (Signed)
  Patient Name: Alexis Madden   MRN: [id number]   Date of Birth/ Sex: 1925-10-08 , female      Admission Date: 09/14/2015  Attending Provider: Rolly SalterPranav M Patel, MD  Primary Diagnosis: Acute CVA (cerebrovascular accident) [facility](HCC)   Indication: Pt was in her usual state of health until this PM, when she was noted to be clammy with HR in 30s while taking a large BM. Code blue was subsequently called. At the time of arrival on scene, ACLS protocol was underway. No CPR/chest compressions were done. Patient was noted to be in atrial fibrillation with RVR, rate up to 150s spontaneously, otherwise hemodynamically stable.   Technical Description:  - CPR performance duration:  0  minute  - Was defibrillation or cardioversion used? No   - Was external pacer placed? No  - Was patient intubated pre/post CPR? No   Medications Administered: Y = Yes; Blank = No Amiodarone    Atropine    Calcium    Epinephrine    Lidocaine    Magnesium    Norepinephrine    Phenylephrine    Sodium bicarbonate    Vasopressin     Post CPR evaluation:  - Final Status - Was patient successfully resuscitated ? Yes - What is current rhythm? Atrial fibrillation with RVR - What is current hemodynamic status? Stable  Miscellaneous Information:  - Labs sent, including: None  - Primary team notified?  Yes  - Family Notified? Per primary  - Additional notes/ transfer status: None     Darrick HuntsmanWilliam R Davione Lenker, MD  09/16/2015, 11:15 PM

## 2015-09-16 NOTE — Care Management Important Message (Signed)
Important Message  Patient Details  Name: Clarene ReamerMinnie Dorer MRN: [id number] Date of Birth: [birth year]   Medicare Important Message Given:       Kyla BalzarineShealy, Carmel Waddington [name] 09/16/2015, 11:23 AM

## 2015-09-16 NOTE — Progress Notes (Signed)
*  PRELIMINARY RESULTS* Vascular Ultrasound Lower extremity venous duplex has been completed.  Preliminary findings: No obvious evidence of DVT or baker's cyst.   Farrel DemarkJill Eunice, RDMS, RVT  09/16/2015, 10:29 AM

## 2015-09-16 NOTE — Progress Notes (Signed)
   09/16/15 2100  Clinical Encounter Type  Visited With Health care provider  Visit Type Initial;Code  Referral From Care management  Consult/Referral To Faith community  Spiritual Encounters  Spiritual Needs Emotional   Chaplain responded to a code blue on 40M @9 :30pm. There were no family at bedside. Pt is now getting care.

## 2015-09-16 NOTE — Care Management Important Message (Signed)
Important Message  Patient Details  Name: Alexis ReamerMinnie Madden MRN: [id number] Date of Birth: 1925/07/13   Medicare Important Message Given:  Yes    [name] [name] 09/16/2015, 11:24 AM

## 2015-09-16 NOTE — Progress Notes (Signed)
Initial Nutrition Assessment   INTERVENTION:  Once Cortrak NGT in place, Initiate Jevity 1.2 @ 20 ml/hr via NGT and increase by 10 ml every 4 hours to goal rate of 55 ml/hr.   30 ml Prostat once daily.    Tube feeding regimen provides 1684 kcal (100% of needs), 80 grams of protein, and 1069 ml of H2O.   When IV fluids are discontinued, recommend adding 100 ml free water flushes every 4 hours to provide additional 600 ml for a total of 1669 ml via NGT daily.   NUTRITION DIAGNOSIS:   Inadequate oral intake related to inability to eat, lethargy/confusion as evidenced by NPO status.   GOAL:   Patient will meet greater than or equal to 90% of their needs   MONITOR:   TF tolerance, Weight trends, Diet advancement, Labs, Skin, I & O's  REASON FOR ASSESSMENT:   Consult Enteral/tube feeding initiation and management  ASSESSMENT:   82 y.o. female with a history of hypertension and hyperlipidemia as well as previous left cerebral infarctions with residual aphasia, brought to the emergency room after being found on the floor of the home.  Pt asleep at time of visit. Mild muscle wasting noted in temples and clavicles, but otherwise pt appears well-nourished. Per daughter-in-law pt was eating very well/normally PTA. Pt has been NPO since 4/12.   Labs reviewed.   Diet Order:  Diet NPO time specified  Skin:  Reviewed, no issues  Last BM:  4/13  Height:   Ht Readings from Last 1 Encounters:  No data found for Ht    Weight:   Wt Readings from Last 1 Encounters:  09/14/15 168 lb 6.9 oz (76.4 kg)    Ideal Body Weight:     BMI:  There is no height on file to calculate BMI.  Estimated Nutritional Needs:   Kcal:  1600-1800  Protein:  80-90 grams  Fluid:  1.6-1.8 L/day  EDUCATION NEEDS:   No education needs identified at this time  Dorothea Ogleeanne Alicen Donalson RD, LDN Inpatient Clinical Dietitian Pager: [phone] After Hours Pager: [phone]

## 2015-09-16 NOTE — Progress Notes (Signed)
RN received message from CCM patients HR >168. Patient had large watery BM, NT called to assist. RN obtained patient automatic BP 138/98 in right arm and manual radial pulse 98. CCM notified RN was currently in patient room. RN continued to monitor and attempted to arouse patient by voice, patient did not respond. Manual radial pulse 138. Patient responded to deep sternal rub by moaning. Patient pulse dropped to 25 then increased to 180's. RN activated code blue.

## 2015-09-16 NOTE — Progress Notes (Signed)
  Echocardiogram 2D Echocardiogram has been performed.  [name], Alexis Madden 09/16/2015, 8:44 AM

## 2015-09-16 NOTE — Progress Notes (Signed)
Called by nursing secretary that help was needed in pt's room.  No details given.  Upon my arrival pt stuporous, pale and clammy with irregular HR 156 on telebox. Floor RN states that when she arrived in room pt's HR was in the 30s and spontaneously increased to 150s.  RR 16 with O2 sats 98% on 4l [place].    Floor had called a code blue which was cancelled at 2145.  No intervention.  12 lead EKG  At 2154 showed Atrial fib with RVR.  Pt  Was given Cardizem 10 mg IV  Per Elray McgregorMary Lynch, NP with only temporary  Decrease in HR to  120's then returned to 140s .     BP 125/79.  Cardiology consulted by Elray McgregorMary Lynch, NP and will be in to see pt.  Hand off report  Given to Aisha, [employer]  Wiill follow as needed

## 2015-09-16 NOTE — Progress Notes (Signed)
Speech Language Pathology Dysphagia Treatment Patient Details Name: Alexis ReamerMinnie Madden MRN: [id number] DOB: Apr 27, 1926 Today's Date: 09/16/2015 Time: [id number] SLP Time Calculation (min) (ACUTE ONLY): 40 min  Assessment / Plan / Recommendation Clinical Impression    Pt required max verbal and tactile cues to attend to session due to somnolence. SLP provided ice chips and thin liquids via tsp to determine PO readiness. Pt demonstrated poor awareness of bolus, resulting in anterior spillage and R lateral sulci pocketing. Suspect delayed swallow initiation due to decreased awareness and lethargy. [name] educated re: continued NPO status, alternative feeding options and benefits vs burdens of alternative feeding options. [name] indicated pt had already signed DNR and does not desire to pursue long term alternative feeding. Son would like to speak to MD to determine if NG feeding is appropriate in this case. SLP will f/u to determine PO readiness and family plans for care.    Diet Recommendation    NPO with meds via alternative means   SLP Plan Continue with current plan of care      Swallowing Goals     General Behavior/Cognition: Lethargic/Drowsy;Requires cueing;Uncooperative Patient Positioning: Upright in bed Oral care provided: Yes HPI: 82 y.o. female with h/o CVA x2 with residual dysarthria/aphasia and HTN who presented to ED with s/s of CVA. Per MD note, pt had last stroke 1 year ago and family has been unable to understand her since. CT Head 4/12 acte appearing infarct R parietal lobe with cytotoxic edema. Old infarct involving the L frontal lobe, posterior L temporal lobe and much of L occipital lobe. Small prior infarct in medial R occipital lobe also evident. CXR 4/12 no edema or consolidation. No prior SLP intervention found in chart.  Oral Cavity - Oral Hygiene     Dysphagia Treatment Family/Caregiver Educated: son and daughter-in-law Treatment  Methods: Skilled observation;Upgraded PO texture trial;Differential diagnosis;Compensation strategy training;Patient/caregiver education Patient observed directly with PO's: Yes Type of PO's observed: Ice chips;Thin liquids Feeding: Total assist Liquids provided via: Teaspoon Oral Phase Signs & Symptoms: Oral holding;Anterior loss/spillage;Right pocketing Pharyngeal Phase Signs & Symptoms: Suspected delayed swallow initiation Type of cueing: Verbal;Tactile Amount of cueing: Maximal   Lynita LombardLauren Alera Quevedo, Student-SLP      Dinora Hemm 09/16/2015, 1:28 PM

## 2015-09-16 NOTE — Consult Note (Signed)
Referring Physician: Dr. Allena Katz Primary Physician: Primary Cardiologist: Reason for Consultation: atrial fibrillation   HPI: Patient is an 82 yo F with h/o HTN, HLD, and previous stroke who presented on 09/14/15 with a large parietal stroke.  Today she went into a fib with RVR and then became bradycardic briefly this evening.  She remained hemodynamically stable and has been stuporous since presentation.  A code was called, but she remained stable throughout and never lost a pulse.  Cardiology was consulted for further recommendations.  Patient is obtunded, so history was obtained from chart review.  Prior to admission, patient was on aspirin and Plavix, given previous stroke.  There is no documented history of atrial fibrillation and she was not on anticoagulation.  Per chart, family does not want to pursue extensive stroke work-up (including TEE) or PEG tube placement at this time.  She remains full code.  EKG on presentation: NSR with LBBB, now a fib with RVR with LBBB  Review of Systems: unable to obtain  Past Medical History  Diagnosis Date  . Stroke (HCC)   . Hypertension     Medications Prior to Admission  Medication Sig Dispense Refill  . amLODipine (NORVASC) 2.5 MG tablet Take 2.5 mg by mouth daily.    Marland Kitchen aspirin EC 81 MG tablet Take 162 mg by mouth at bedtime.    . clopidogrel (PLAVIX) 75 MG tablet Take 75 mg by mouth daily.    . Folic Acid-Vit B6-Vit B12 (FOLBEE) 2.5-25-1 MG TABS tablet Take 1 tablet by mouth daily.    Marland Kitchen losartan (COZAAR) 100 MG tablet Take 100 mg by mouth daily.    Marland Kitchen lovastatin (MEVACOR) 40 MG tablet Take 40 mg by mouth at bedtime.    . metoprolol (LOPRESSOR) 100 MG tablet Take 100 mg by mouth 2 (two) times daily.       Marland Kitchen aspirin  300 mg Rectal Daily   Or  . aspirin  325 mg Oral Daily  . enoxaparin (LOVENOX) injection  40 mg Subcutaneous Q24H  . feeding supplement (PRO-STAT SUGAR FREE 64)  30 mL Per Tube Daily  . metoprolol      . [START ON  09/17/2015] metoprolol tartrate  25 mg Per Tube Q6H  . pantoprazole (PROTONIX) IV  40 mg Intravenous QHS    Infusions: . sodium chloride 50 mL/hr at 09/15/15 1643  . feeding supplement (JEVITY 1.2 CAL) 1,000 mL (09/16/15 1804)    Allergies  Allergen Reactions  . Lisinopril Cough  . Sulfamethoxazole-Trimethoprim Nausea Only  . Sulfamethoxazole Rash    Social History   Social History  . Marital Status: Single    Spouse Name: N/A  . Number of Children: N/A  . Years of Education: N/A   Occupational History  . Not on file.   Social History Main Topics  . Smoking status: Never Smoker   . Smokeless tobacco: Never Used  . Alcohol Use: No  . Drug Use: No  . Sexual Activity: No   Other Topics Concern  . Not on file   Social History Narrative    Family History  Problem Relation Age of Onset  . Stroke Mother   . Hypertension Other     PHYSICAL EXAM: Filed Vitals:   09/16/15 2224 09/16/15 2245  BP: 125/79 147/87  Pulse: 146 160  Temp: 98.6 F (37 C) 99.5 F (37.5 C)  Resp: 22 20     Intake/Output Summary (Last 24 hours) at 09/16/15 2252 Last data filed at 09/16/15  1435  Gross per 24 hour  Intake      0 ml  Output    850 ml  Net   -850 ml    General:  Elderly CA F, laying in bed, intermittently moaning HEENT: large left scalp hematoma, NGT in place Neck: supple. no JVD. Carotids 2+ bilat; right bruit appreciated Cor: PMI nondisplaced. Irregularly irregular, 2/6 holosystolic murmur Lungs: clear, anteriorly Abdomen: soft, nontender, nondistended. Good bowel sounds. Extremities: no cyanosis, clubbing, rash, edema Neuro: obtunded, unresponsive  ECG: a fib with rvr, LBBB, previous ECG on admission NSR with LBBB  Results for orders placed or performed during the hospital encounter of 09/14/15 (from the past 24 hour(s))  Glucose, capillary     Status: Abnormal   Collection Time: 09/16/15 12:14 AM  Result Value Ref Range   Glucose-Capillary 106 (H) 65 - 99  mg/dL   Comment 1 Notify RN    Comment 2 Document in Chart   Glucose, capillary     Status: Abnormal   Collection Time: 09/16/15  4:01 AM  Result Value Ref Range   Glucose-Capillary 121 (H) 65 - 99 mg/dL   Comment 1 Notify RN    Comment 2 Document in Chart   CBC     Status: Abnormal   Collection Time: 09/16/15  5:29 AM  Result Value Ref Range   WBC 8.3 4.0 - 10.5 K/uL   RBC 3.70 (L) 3.87 - 5.11 MIL/uL   Hemoglobin 11.8 (L) 12.0 - 15.0 g/dL   HCT 16.136.1 09.636.0 - 04.546.0 %   MCV 97.6 78.0 - 100.0 fL   MCH 31.9 26.0 - 34.0 pg   MCHC 32.7 30.0 - 36.0 g/dL   RDW 40.913.6 81.111.5 - 91.415.5 %   Platelets 245 150 - 400 K/uL  Basic metabolic panel     Status: Abnormal   Collection Time: 09/16/15  5:29 AM  Result Value Ref Range   Sodium 139 135 - 145 mmol/L   Potassium 3.7 3.5 - 5.1 mmol/L   Chloride 101 101 - 111 mmol/L   CO2 22 22 - 32 mmol/L   Glucose, Bld 130 (H) 65 - 99 mg/dL   BUN 11 6 - 20 mg/dL   Creatinine, Ser 7.821.19 (H) 0.44 - 1.00 mg/dL   Calcium 9.2 8.9 - 95.610.3 mg/dL   GFR calc non Af Amer 40 (L) >60 mL/min   GFR calc Af Amer 46 (L) >60 mL/min   Anion gap 16 (H) 5 - 15  Protime-INR     Status: None   Collection Time: 09/16/15  5:29 AM  Result Value Ref Range   Prothrombin Time 15.0 11.6 - 15.2 seconds   INR 1.16 0.00 - 1.49  Glucose, capillary     Status: Abnormal   Collection Time: 09/16/15 11:28 AM  Result Value Ref Range   Glucose-Capillary 107 (H) 65 - 99 mg/dL   Comment 1 Notify RN    Comment 2 Document in Chart   Lactic acid, plasma     Status: None   Collection Time: 09/16/15  2:22 PM  Result Value Ref Range   Lactic Acid, Venous 1.1 0.5 - 2.0 mmol/L  Lipase, blood     Status: None   Collection Time: 09/16/15  2:23 PM  Result Value Ref Range   Lipase 26 11 - 51 U/L  Hepatic function panel     Status: Abnormal   Collection Time: 09/16/15  2:23 PM  Result Value Ref Range   Total Protein 6.9 6.5 -  8.1 g/dL   Albumin 3.3 (L) 3.5 - 5.0 g/dL   AST 72 (H) 15 - 41 U/L   ALT  28 14 - 54 U/L   Alkaline Phosphatase 107 38 - 126 U/L   Total Bilirubin 1.2 0.3 - 1.2 mg/dL   Bilirubin, Direct 0.2 0.1 - 0.5 mg/dL   Indirect Bilirubin 1.0 (H) 0.3 - 0.9 mg/dL  Glucose, capillary     Status: None   Collection Time: 09/16/15  4:45 PM  Result Value Ref Range   Glucose-Capillary 99 65 - 99 mg/dL   Comment 1 Notify RN    Comment 2 Document in Chart   Glucose, capillary     Status: Abnormal   Collection Time: 09/16/15  8:32 PM  Result Value Ref Range   Glucose-Capillary 120 (H) 65 - 99 mg/dL   Comment 1 Notify RN    Comment 2 Document in Chart   Glucose, capillary     Status: Abnormal   Collection Time: 09/16/15  9:33 PM  Result Value Ref Range   Glucose-Capillary 133 (H) 65 - 99 mg/dL  CBC     Status: Abnormal   Collection Time: 09/16/15  9:41 PM  Result Value Ref Range   WBC 9.8 4.0 - 10.5 K/uL   RBC 3.61 (L) 3.87 - 5.11 MIL/uL   Hemoglobin 11.6 (L) 12.0 - 15.0 g/dL   HCT 16.1 (L) 09.6 - 04.5 %   MCV 96.7 78.0 - 100.0 fL   MCH 32.1 26.0 - 34.0 pg   MCHC 33.2 30.0 - 36.0 g/dL   RDW 40.9 81.1 - 91.4 %   Platelets 246 150 - 400 K/uL  Basic metabolic panel     Status: Abnormal   Collection Time: 09/16/15  9:41 PM  Result Value Ref Range   Sodium 138 135 - 145 mmol/L   Potassium 3.3 (L) 3.5 - 5.1 mmol/L   Chloride 106 101 - 111 mmol/L   CO2 19 (L) 22 - 32 mmol/L   Glucose, Bld 159 (H) 65 - 99 mg/dL   BUN 16 6 - 20 mg/dL   Creatinine, Ser 7.82 (H) 0.44 - 1.00 mg/dL   Calcium 8.8 (L) 8.9 - 10.3 mg/dL   GFR calc non Af Amer 44 (L) >60 mL/min   GFR calc Af Amer 52 (L) >60 mL/min   Anion gap 13 5 - 15  Troponin I (q 6hr x 3)     Status: Abnormal   Collection Time: 09/16/15  9:41 PM  Result Value Ref Range   Troponin I 0.05 (H) <0.031 ng/mL     ASSESSMENT/PLAN:  1. Atrial fibrillation with RVR. CHADS-Vasc = 7. Hemodynamically stable, despite brief episode of bradycardia. Recommend rate-control with metoprolol 25 mg NG q6hr Anticoagulation and further  evaluation with TEE, per primary team, neurology, and family's goals of care - if plan to start anticoagulation, would stop Plavix and start with heparin gtt, if well-tolerated, could consider Eliquis 2.5 mg BID  2. Elevated troponin. Only slightly elevated at 0.05, likely demand due to a fib with RVR vs 2/2 intracranial process. Less likely ACS.  TTE today with normal biventricular function and mild-moderate valvular regurgitation. EKG without obvious acute ischemic changes (LBBB chronic) Ok to trend troponins, although unlikely to change management.  3. Hypertension. Permissive HTN in setting of stroke. Continue to hold home losartan and amlodipine  4. Hyperlipidemia. Consider addition of high-potency statin

## 2015-09-16 NOTE — Progress Notes (Signed)
Event Note:    Called patient with HR 160's - see RN note related to event. Apparently per centralized telemetry HR 168 bpm - with subsequent drop to ~25 then up to 180 - Code Blue initiated - Provider called to bedside. Patient w pulse and HR 140's BP 133/98. Patient with eyes open - minimally responsive, moaning in response to tactile stimuli.  Intervention: EKG showing Afib with RVR - evaluating troponin - given Cardizem 10 mg IV bolus HR transiently decreased to 118 bpm but return to 140's. Have consulted cardiology. Giving metoprolol 5 mg IV and will start metoprolol 25 mg q6 hours via Panda tube. Continue to monitor.

## 2015-09-17 DIAGNOSIS — I480 Paroxysmal atrial fibrillation: Secondary | ICD-10-CM

## 2015-09-17 DIAGNOSIS — I639 Cerebral infarction, unspecified: Secondary | ICD-10-CM

## 2015-09-17 LAB — CBC WITH DIFFERENTIAL/PLATELET
Basophils Absolute: 0 10*3/uL (ref 0.0–0.1)
Basophils Relative: 0 %
EOS PCT: 2 %
Eosinophils Absolute: 0.1 10*3/uL (ref 0.0–0.7)
HCT: 33.5 % — ABNORMAL LOW (ref 36.0–46.0)
HEMOGLOBIN: 10.8 g/dL — AB (ref 12.0–15.0)
LYMPHS ABS: 2.1 10*3/uL (ref 0.7–4.0)
LYMPHS PCT: 31 %
MCH: 31.3 pg (ref 26.0–34.0)
MCHC: 32.2 g/dL (ref 30.0–36.0)
MCV: 97.1 fL (ref 78.0–100.0)
Monocytes Absolute: 0.7 10*3/uL (ref 0.1–1.0)
Monocytes Relative: 10 %
Neutro Abs: 3.9 10*3/uL (ref 1.7–7.7)
Neutrophils Relative %: 57 %
PLATELETS: 234 10*3/uL (ref 150–400)
RBC: 3.45 MIL/uL — AB (ref 3.87–5.11)
RDW: 13.7 % (ref 11.5–15.5)
WBC: 6.9 10*3/uL (ref 4.0–10.5)

## 2015-09-17 LAB — COMPREHENSIVE METABOLIC PANEL
ALK PHOS: 93 U/L (ref 38–126)
ALT: 23 U/L (ref 14–54)
ANION GAP: 9 (ref 5–15)
AST: 49 U/L — ABNORMAL HIGH (ref 15–41)
Albumin: 2.8 g/dL — ABNORMAL LOW (ref 3.5–5.0)
BUN: 19 mg/dL (ref 6–20)
CALCIUM: 8.4 mg/dL — AB (ref 8.9–10.3)
CO2: 24 mmol/L (ref 22–32)
CREATININE: 1.15 mg/dL — AB (ref 0.44–1.00)
Chloride: 106 mmol/L (ref 101–111)
GFR calc non Af Amer: 41 mL/min — ABNORMAL LOW (ref >60)
GFR, EST AFRICAN AMERICAN: 48 mL/min — AB (ref >60)
GLUCOSE: 134 mg/dL — AB (ref 65–99)
Potassium: 3.5 mmol/L (ref 3.5–5.1)
Sodium: 139 mmol/L (ref 135–145)
TOTAL PROTEIN: 5.8 g/dL — AB (ref 6.5–8.1)
Total Bilirubin: 0.6 mg/dL (ref 0.3–1.2)

## 2015-09-17 LAB — PROTIME-INR
INR: 1.1 (ref 0.00–1.49)
PROTHROMBIN TIME: 14.4 s (ref 11.6–15.2)

## 2015-09-17 LAB — MAGNESIUM: MAGNESIUM: 1.8 mg/dL (ref 1.7–2.4)

## 2015-09-17 LAB — GLUCOSE, CAPILLARY
GLUCOSE-CAPILLARY: 121 mg/dL — AB (ref 65–99)
GLUCOSE-CAPILLARY: 124 mg/dL — AB (ref 65–99)
GLUCOSE-CAPILLARY: 138 mg/dL — AB (ref 65–99)
GLUCOSE-CAPILLARY: 141 mg/dL — AB (ref 65–99)
Glucose-Capillary: 142 mg/dL — ABNORMAL HIGH (ref 65–99)
Glucose-Capillary: 150 mg/dL — ABNORMAL HIGH (ref 65–99)
Glucose-Capillary: 150 mg/dL — ABNORMAL HIGH (ref 65–99)

## 2015-09-17 LAB — TROPONIN I
TROPONIN I: 0.07 ng/mL — AB (ref <0.031)
Troponin I: 0.07 ng/mL — ABNORMAL HIGH (ref <0.031)

## 2015-09-17 NOTE — Progress Notes (Signed)
STROKE TEAM PROGRESS NOTE   HISTORY OF PRESENT ILLNESS Alexis Madden is an 82 y.o. female with a history of hypertension and hyperlipidemia as well as previous left cerebral infarctions with residual aphasia, brought to the emergency room after being found on the floor of the home [place] and. She was last seen well at about 1:30 PM. She was noted to have ecchymosis and swelling involving the left frontal and periorbital region. She was stuporous and has remained stuporous. CT scan of her head showed an acute right parietal area of hypoattenuation, likely acute infarction, in addition to previous left cerebral infarctions. Patient has been taking aspirin and Plavix daily. She was last known well 1:30 PM on 09/14/2015. Patient was not administered IV t-PA secondary to large acute cerebral infarction demonstrated on CT scan and beyond time window for treatment consideration.  She was admitted for further evaluation and treatment.   SUBJECTIVE (INTERVAL HISTORY)  Son was at the bedside.  I spoke to him at length.  We reviewed the diagnosis and prognosis.  I explained that this would not be the time to anticoagulate given size of the infarct.  Palliative Care will help guide decisions  OBJECTIVE Temp:  [97.9 F (36.6 C)-99.5 F (37.5 C)] 98.1 F (36.7 C) (04/15 0514) Pulse Rate:  [73-160] 99 (04/15 0514) Cardiac Rhythm:  [-] Atrial fibrillation (04/14 2224) Resp:  [16-22] 18 (04/15 0514) BP: (125-190)/(73-98) 128/98 mmHg (04/15 0514) SpO2:  [92 %-97 %] 97 % (04/15 0514)  CBC:  Recent Labs Lab 09/14/15 1959  09/16/15 2141 09/17/15 0717  WBC 7.8  < > 9.8 6.9  NEUTROABS 5.5  --   --  3.9  HGB 11.4*  < > 11.6* 10.8*  HCT 34.9*  < > 34.9* 33.5*  MCV 97.5  < > 96.7 97.1  PLT 243  < > 246 234  < > = values in this interval not displayed.  Basic Metabolic Panel:   Recent Labs Lab 09/16/15 2141 09/17/15 0717  NA 138 139  K 3.3* 3.5  CL 106 106  CO2 19* 24  GLUCOSE 159* 134*  BUN 16 19   CREATININE 1.08* 1.15*  CALCIUM 8.8* 8.4*  MG  --  1.8    Lipid Panel:     Component Value Date/Time   CHOL 138 09/15/2015 0531   TRIG 73 09/15/2015 0531   HDL 56 09/15/2015 0531   CHOLHDL 2.5 09/15/2015 0531   VLDL 15 09/15/2015 0531   LDLCALC 67 09/15/2015 0531   HgbA1c:  Lab Results  Component Value Date   HGBA1C 6.3* 09/15/2015   Urine Drug Screen: No results found for: LABOPIA, COCAINSCRNUR, LABBENZ, AMPHETMU, THCU, LABBARB    IMAGING  I have personally reviewed the radiological images below and agree with the radiology interpretations.  Ct Head Wo Contrast 09/14/2015  Acute appearing infarct right parietal lobe with cytotoxic edema. Old infarcts involving the left frontal lobe, posterior left temporal lobe, and much of the left occipital lobe. A small prior infarct in the medial right occipital lobe is also evident. There is moderate small vessel disease throughout the centra semiovale bilaterally. No intracranial hemorrhage. No extra-axial fluid collections. There is a left frontal scalp hematoma. No fracture evident.   CT HEAD  09/15/2015  Subcutaneous hematoma left supraorbital region without skull fracture or intracranial hemorrhage. Moderate to large acute posterior right frontal -parietal lobe infarct without associated hemorrhage. Mild local mass effect. Remote large left posterior frontal -parietal lobe infarct with encephalomalacia and subsequent dilation  left lateral ventricle. Global atrophy. Prominent small vessel disease changes.   CTA NECK  09/15/2015  Mild to slightly moderate narrowing proximal left subclavian artery. Mild narrowing proximal right subclavian artery. Ectatic common carotid arteries. 64% diameter stenosis proximal right internal carotid artery. 42% diameter stenosis proximal left internal carotid artery. Mild narrowing proximal left vertebral artery. Left vertebral artery is dominant. Mild narrowing origin of the right vertebral artery. Cervical  spondylotic changes with kyphosis centered at the C5 level. Various degrees of spinal stenosis and foraminal narrowing. No obvious fracture. Scarring lung apices without mass noted. Top-normal to slightly prominent size upper mediastinal lymph nodes.   CTA HEAD  09/15/2015  Moderate narrowing M1 segment middle cerebral artery greater on the right. Decrease number of visualized middle cerebral artery branches bilaterally with moderate narrowing and irregularity of visualized branches. Cavernous segment internal carotid artery calcified plaque with mild narrowing bilaterally. Fetal type contribution to the left posterior cerebral artery. Right vertebral artery ends in a posterior inferior cerebellar artery distribution. Moderate narrowing and irregularity of the visualized aspect of the right vertebral artery. Calcified plaque left internal carotid artery with mild narrowing. Plaque with mild narrowing basilar artery. Mild to moderate narrowing of portions of the posterior cerebral artery bilaterally.   Dg Chest Port 1 View 09/14/2015   No edema or consolidation. There is cardiomegaly. There is a skin fold on the right but no apparent pneumothorax.   2-D echocardiogram - Left ventricle: The cavity size was normal. Wall thickness was increased in a pattern of moderate LVH. Systolic function was normal. The estimated ejection fraction was in the range of 60% to 65%. Wall motion was normal; there were no regional wall motion abnormalities. Features are consistent with a pseudonormal left ventricular filling pattern, with concomitant abnormal relaxation and increased filling pressure (grade 2 diastolic dysfunction). - Aortic valve: There was mild regurgitation. - Mitral valve: Moderately calcified annulus. Mildly thickened leaflets . The findings are consistent with mild stenosis. There was mild regurgitation. - Right atrium: The atrium was mildly dilated. - Tricuspid valve: There was moderate regurgitation. -  Pulmonary arteries: Systolic pressure was moderately increased. PA peak pressure: 48 mm Hg (S).   LE venous doppler - negative for DVT. No obvious evidence of DVT or baker's cyst.   PHYSICAL EXAM  Temp:  [97.9 F (36.6 C)-99.5 F (37.5 C)] 98.1 F (36.7 C) (04/15 0514) Pulse Rate:  [73-160] 99 (04/15 0514) Resp:  [16-22] 18 (04/15 0514) BP: (125-190)/(73-98) 128/98 mmHg (04/15 0514) SpO2:  [92 %-97 %] 97 % (04/15 0514)  General - Well nourished, well developed, drowsy but arousable.  Ophthalmologic - Fundi not visualized due to noncooperation.  Cardiovascular - irregularly irregular heart rate and rhythm, but on tele concerning for MAT.  Neuro - drowsy but open eyes on voice, fell back to sleep without constant stimulation, global aphasia, not following commands, respond to pain stimulatin.  PERRL, no gaze deviation, no neglect, eyes attending to both directions, however, not blinking to visual threat bilaterally. No facial asymmetry, tongue in middle. RUE and RLE as well as LLE spontaneous movement against gravity, LUE flaccid, 3-/5 on pain stimulation. DTR 1+ and no babinski. Not cooperative on exam for sensory, coordination or gait.   ASSESSMENT/PLAN Ms. Alexis Madden is a 82 y.o. female with history of hypertension, hyperlipidemia and previous left brain infarct with residual aphasia found down at home. CT showed a right parietal infarct.  She did not receive IV t-PA due to large infarct demonstrated on  CT and beyond time window for treatment consideration.    Stroke:  Moderate to large right frontal-parietal infarct embolic secondary to unknown source, concerning for afib as tele showed MAT. Depends on pt improvement, will consider further stroke work up, cardiac monitoring or anticoagulation choice later.   Previous infarct felt to be embolic, patient refused cardiac monitoring   Resultant  Left UE paresis, dysphagia, stuporous state  CT head  Large moderate to right  frontal-parietal lobe infarct with cytotoxic edema, old left posterior frontal parietal lobe infarct  CTA head  Diffuse atherosclerosis  CTA neck  Bilateral ICA therosclerosis, no significant stenosis  2D Echo EF 60-65% No source of embolus   LE venous Dopplers - negative for DVT.  LDL  67   HgbA1c 6.3  Lovenox 40 mg sq daily for VTE prophylaxis Diet NPO time specified  aspirin 81 mg daily and clopidogrel 75 mg daily prior to admission, now on aspirin 300 mg suppository daily. Family against PEG tube according to pt wishes, will hold off further stroke work up, cardiac monitoring and anticoagulation choice at this time. Revisit depends on pt improvement.   Ongoing aggressive stroke risk factor management  Therapy recommendations:  SNF recommended  Disposition:  pending ( living independently prior to admission )   Hx of stroke 1. 1st 2 years ago - recovered quickly 2. 2nd one year ago - with residue expressive aphasia 3. Refused cardiac monitoring at that time 4. Living independently  Hypertension  Stable today  Permissive hypertension (OK if < 220/120) but gradually normalize in 5-7 days  Hyperlipidemia  Home meds:  mevacor 40  LDL 67, goal < 70  Resume statin once able to swallow, depending on plan of care   Atrial fibrillation  Now documented on telemetry   Other Stroke Risk Factors  Advanced age  History of stroke, last TIA  Family hx stroke (mother)  Atrial fibrillation - not anticoagulated   Other Active Problems  Elevated creatinine  Anemia, hemoglobin 11.8  NPO - Family against PEG - agreeable to NG tube  Palliative care consult pending  Hold anticoagulation for at least 2 weeks secondary to the size of the infarct. Continue to treat with aspirin until then.  Hospital day # 3   Delton SeeDavid Rinehuls [facility] Pager [phone] 09/17/2015, 3:02 PM  ATTENDING NOTE: Patient was seen and examined by me personally.  Documentation reflects findings. The laboratory and radiographic studies reviewed by me. ROS:  pertinent positives could not be fully documented due to LOC  Condition:  Neurologic injury significant to acute stroke and bilaterality in the setting of MCA stroke on the contralateral side  Assessment and plan completed by me personally and fully documented above. Plans/Recommendations include:     Palliative Care for decision guidance  Stroke work-up comprehensive to date  No anticoagulation in acute setting due to risk for hemorrhage   SIGNED BY: Dr. Sula Sodahere Takerra Lupinacci   To contact Stroke Continuity provider, please refer to [email or web address]. After hours, contact General Neurology

## 2015-09-17 NOTE — Progress Notes (Signed)
Patient ID: Alexis Madden, female   DOB: 18-Oct-1925, 82 y.o.   MRN: [id number]    Patient Name: Alexis Madden Date of Encounter: 09/17/2015     Principal Problem:   Acute CVA (cerebrovascular accident) [facility]) Active Problems:   History of stroke   Hypertension   Urinary retention   Protein-calorie malnutrition (HCC)   Abdominal pain, acute   Encounter for feeding tube placement    SUBJECTIVE  No chest pain or sob. Non-communicative.  CURRENT MEDS . aspirin  300 mg Rectal Daily   Or  . aspirin  325 mg Oral Daily  . enoxaparin (LOVENOX) injection  40 mg Subcutaneous Q24H  . feeding supplement (PRO-STAT SUGAR FREE 64)  30 mL Per Tube Daily  . metoprolol tartrate  25 mg Per Tube Q6H  . pantoprazole (PROTONIX) IV  40 mg Intravenous QHS    OBJECTIVE  Filed Vitals:   09/16/15 2245 09/17/15 0051 09/17/15 0514 09/17/15 0923  BP: 147/87 128/83 128/98 137/90  Pulse: 160 83 99 110  Temp: 99.5 F (37.5 C) 98.2 F (36.8 C) 98.1 F (36.7 C) 99 F (37.2 C)  TempSrc: Axillary Axillary Axillary Axillary  Resp: Weight:      SpO2: 95% 97% 97% 97%    Intake/Output Summary (Last 24 hours) at 09/17/15 1321 Last data filed at 09/17/15 0100  Gross per 24 hour  Intake      0 ml  Output   1200 ml  Net  -1200 ml   Filed Weights   09/14/15 2258  Weight: 168 lb 6.9 oz (76.4 kg)    PHYSICAL EXAM  General: Pleasant, NAD. Neuro: Alert and oriented X 3. Moves all extremities spontaneously. Psych: Normal affect. HEENT:  Normal  Neck: Supple without bruits or JVD. Lungs:  Resp regular and unlabored, CTA. Heart: RRR no s3, s4, or murmurs. Abdomen: Soft, non-tender, non-distended, BS + x 4.  Extremities: No clubbing, cyanosis or edema. DP/PT/Radials 2+ and equal bilaterally.  Accessory Clinical Findings  CBC  Recent Labs  09/14/15 1959  09/16/15 2141 09/17/15 0717  WBC 7.8  < > 9.8 6.9  NEUTROABS 5.5  --   --  3.9  HGB 11.4*  < > 11.6* 10.8*  HCT 34.9*  < >  34.9* 33.5*  MCV 97.5  < > 96.7 97.1  PLT 243  < > 246 234  < > = values in this interval not displayed. Basic Metabolic Panel  Recent Labs  09/16/15 2141 09/17/15 0717  NA 138 139  K 3.3* 3.5  CL 106 106  CO2 19* 24  GLUCOSE 159* 134*  BUN 16 19  CREATININE 1.08* 1.15*  CALCIUM 8.8* 8.4*  MG  --  1.8   Liver Function Tests  Recent Labs  09/16/15 1423 09/17/15 0717  AST 72* 49*  ALT 28 23  ALKPHOS 107 93  BILITOT 1.2 0.6  PROT 6.9 5.8*  ALBUMIN 3.3* 2.8*    Recent Labs  09/16/15 1423  LIPASE 26   Cardiac Enzymes  Recent Labs  09/16/15 2141 09/17/15 0717 09/17/15 0947  TROPONINI 0.05* 0.07* 0.07*   BNP Invalid input(s): POCBNP D-Dimer No results for input(s): DDIMER in the last 72 hours. Hemoglobin A1C  Recent Labs  09/15/15 0531  HGBA1C 6.3*   Fasting Lipid Panel  Recent Labs  09/15/15 0531  CHOL 138  HDL 56  LDLCALC 67  TRIG 73  CHOLHDL 2.5   Thyroid Function Tests No results for input(s): TSH, T4TOTAL,  T3FREE, THYROIDAB in the last 72 hours.  Invalid input(s): FREET3  TELE  Atrial fib with a RVR/CVR  Radiology/Studies  Ct Angio Head W/cm &/or Wo Cm  09/15/2015  CLINICAL DATA:  82 year old hypertensive female with altered mental status post fall. Subsequent encounter. EXAM: CT ANGIOGRAPHY HEAD AND NECK TECHNIQUE: Multidetector CT imaging of the head and neck was performed using the standard protocol during bolus administration of intravenous contrast. Multiplanar CT image reconstructions and MIPs were obtained to evaluate the vascular anatomy. Carotid stenosis measurements (when applicable) are obtained utilizing NASCET criteria, using the distal internal carotid diameter as the denominator. CONTRAST:  50 cc Isovue 370. COMPARISON:  09/14/2015 head CT. FINDINGS: CT HEAD Brain: Subcutaneous hematoma left supraorbital region without skull fracture or intracranial hemorrhage. Moderate to large acute posterior right frontal -parietal lobe  infarct without associated hemorrhage. Mild local mass effect. Remote large left posterior frontal -parietal lobe infarct with encephalomalacia and subsequent dilation left lateral ventricle. Global atrophy. Prominent small vessel disease changes. No intracranial enhancing lesion. Calvarium and skull base: Negative. Paranasal sinuses: Clear. Orbits: No acute abnormality.  Post lens replacement. CTA NECK Aortic arch: Plaque with 3 vessel arch. Plaque with mild to slightly moderate narrowing proximal left subclavian artery. Plaque with moderate narrowing proximal right subclavian artery. Right carotid system: Ectatic common carotid artery. Plaque carotid bifurcation extending into the internal carotid artery with 64% diameter stenosis proximal right internal carotid artery. Left carotid system: Ectatic left common carotid artery. Plaque left carotid bifurcation extending into the left internal carotid artery. 42% diameter stenosis proximal left internal carotid artery. Vertebral arteries:Plaque with mild narrowing proximal left vertebral artery. Left vertebral artery is dominant. Mild narrowing origin of the right vertebral artery. Skeleton: Cervical spondylotic changes with kyphosis centered at the C5 level. Various degrees of spinal stenosis and foraminal narrowing. No obvious fracture. Other neck: Scarring lung apices without mass noted. Top-normal slightly prominent size upper mediastinal lymph nodes. No worrisome neck mass. CTA HEAD Anterior circulation: Moderate narrowing M1 segment middle cerebral artery greater on the right. Decrease number of visualized middle cerebral artery branches bilaterally with a moderate narrowing and irregularity of visualize branches. Cavernous segment internal carotid artery calcified plaque with mild narrowing bilaterally. Fetal type contribution to the left posterior cerebral artery. Posterior circulation: Right vertebral artery ends in a posterior inferior cerebellar artery  distribution. Moderate narrowing and irregularity of the visualized aspect of the right vertebral artery. Calcified plaque left internal carotid artery with mild narrowing. Plaque with mild narrowing basilar artery. Mild to moderate narrowing of portions of the posterior cerebral artery bilaterally. Venous sinuses: Patent. Anatomic variants: As above. Delayed phase: As above. IMPRESSION: CT HEAD Subcutaneous hematoma left supraorbital region without skull fracture or intracranial hemorrhage. Moderate to large acute posterior right frontal -parietal lobe infarct without associated hemorrhage. Mild local mass effect. Remote large left posterior frontal -parietal lobe infarct with encephalomalacia and subsequent dilation left lateral ventricle. Global atrophy. Prominent small vessel disease changes. CTA NECK Mild to slightly moderate narrowing proximal left subclavian artery. Mild narrowing proximal right subclavian artery. Ectatic common carotid arteries. 64% diameter stenosis proximal right internal carotid artery. 42% diameter stenosis proximal left internal carotid artery. Mild narrowing proximal left vertebral artery. Left vertebral artery is dominant. Mild narrowing origin of the right vertebral artery. Cervical spondylotic changes with kyphosis centered at the C5 level. Various degrees of spinal stenosis and foraminal narrowing. No obvious fracture. Scarring lung apices without mass noted. Top-normal to slightly prominent size upper mediastinal lymph nodes.  CTA HEAD Moderate narrowing M1 segment middle cerebral artery greater on the right. Decrease number of visualized middle cerebral artery branches bilaterally with moderate narrowing and irregularity of visualized branches. Cavernous segment internal carotid artery calcified plaque with mild narrowing bilaterally. Fetal type contribution to the left posterior cerebral artery. Right vertebral artery ends in a posterior inferior cerebellar artery distribution.  Moderate narrowing and irregularity of the visualized aspect of the right vertebral artery. Calcified plaque left internal carotid artery with mild narrowing. Plaque with mild narrowing basilar artery. Mild to moderate narrowing of portions of the posterior cerebral artery bilaterally. Electronically Signed   By: Lacy Duverney M.D.   On: 09/15/2015 12:42   Ct Head Wo Contrast  09/14/2015  CLINICAL DATA:  Altered mental status with apparent fall EXAM: CT HEAD WITHOUT CONTRAST TECHNIQUE: Contiguous axial images were obtained from the base of the skull through the vertex without intravenous contrast. COMPARISON:  None. FINDINGS: There is moderate diffuse atrophy. There is no intracranial mass, hemorrhage, extra-axial fluid collection, or midline shift. There is evidence of prior infarcts involving the posterior temporal and anterior to mid left occipital lobes as well as in the mid left frontal lobe. There is small vessel disease throughout the centra semiovale bilaterally. There is evidence of a prior small infarct in the posterior medial right occipital lobe. There is an acute appearing infarct in the right parietal lobe with cytotoxic edema in this area. No other acute infarct is evident. The bony calvarium appears intact. There is a left frontal scalp hematoma. The mastoid air cells are clear. Orbits appear symmetric bilaterally. IMPRESSION: Acute appearing infarct right parietal lobe with cytotoxic edema. Old infarcts involving the left frontal lobe, posterior left temporal lobe, and much of the left occipital lobe. A small prior infarct in the medial right occipital lobe is also evident. There is moderate small vessel disease throughout the centra semiovale bilaterally. No intracranial hemorrhage. No extra-axial fluid collections. There is a left frontal scalp hematoma. No fracture evident. Electronically Signed   By: Bretta Bang III M.D.   On: 09/14/2015 20:45   Ct Angio Neck W/cm &/or  Wo/cm  09/15/2015  CLINICAL DATA:  82 year old hypertensive female with altered mental status post fall. Subsequent encounter. EXAM: CT ANGIOGRAPHY HEAD AND NECK TECHNIQUE: Multidetector CT imaging of the head and neck was performed using the standard protocol during bolus administration of intravenous contrast. Multiplanar CT image reconstructions and MIPs were obtained to evaluate the vascular anatomy. Carotid stenosis measurements (when applicable) are obtained utilizing NASCET criteria, using the distal internal carotid diameter as the denominator. CONTRAST:  50 cc Isovue 370. COMPARISON:  09/14/2015 head CT. FINDINGS: CT HEAD Brain: Subcutaneous hematoma left supraorbital region without skull fracture or intracranial hemorrhage. Moderate to large acute posterior right frontal -parietal lobe infarct without associated hemorrhage. Mild local mass effect. Remote large left posterior frontal -parietal lobe infarct with encephalomalacia and subsequent dilation left lateral ventricle. Global atrophy. Prominent small vessel disease changes. No intracranial enhancing lesion. Calvarium and skull base: Negative. Paranasal sinuses: Clear. Orbits: No acute abnormality.  Post lens replacement. CTA NECK Aortic arch: Plaque with 3 vessel arch. Plaque with mild to slightly moderate narrowing proximal left subclavian artery. Plaque with moderate narrowing proximal right subclavian artery. Right carotid system: Ectatic common carotid artery. Plaque carotid bifurcation extending into the internal carotid artery with 64% diameter stenosis proximal right internal carotid artery. Left carotid system: Ectatic left common carotid artery. Plaque left carotid bifurcation extending into the left internal carotid  artery. 42% diameter stenosis proximal left internal carotid artery. Vertebral arteries:Plaque with mild narrowing proximal left vertebral artery. Left vertebral artery is dominant. Mild narrowing origin of the right vertebral  artery. Skeleton: Cervical spondylotic changes with kyphosis centered at the C5 level. Various degrees of spinal stenosis and foraminal narrowing. No obvious fracture. Other neck: Scarring lung apices without mass noted. Top-normal slightly prominent size upper mediastinal lymph nodes. No worrisome neck mass. CTA HEAD Anterior circulation: Moderate narrowing M1 segment middle cerebral artery greater on the right. Decrease number of visualized middle cerebral artery branches bilaterally with a moderate narrowing and irregularity of visualize branches. Cavernous segment internal carotid artery calcified plaque with mild narrowing bilaterally. Fetal type contribution to the left posterior cerebral artery. Posterior circulation: Right vertebral artery ends in a posterior inferior cerebellar artery distribution. Moderate narrowing and irregularity of the visualized aspect of the right vertebral artery. Calcified plaque left internal carotid artery with mild narrowing. Plaque with mild narrowing basilar artery. Mild to moderate narrowing of portions of the posterior cerebral artery bilaterally. Venous sinuses: Patent. Anatomic variants: As above. Delayed phase: As above. IMPRESSION: CT HEAD Subcutaneous hematoma left supraorbital region without skull fracture or intracranial hemorrhage. Moderate to large acute posterior right frontal -parietal lobe infarct without associated hemorrhage. Mild local mass effect. Remote large left posterior frontal -parietal lobe infarct with encephalomalacia and subsequent dilation left lateral ventricle. Global atrophy. Prominent small vessel disease changes. CTA NECK Mild to slightly moderate narrowing proximal left subclavian artery. Mild narrowing proximal right subclavian artery. Ectatic common carotid arteries. 64% diameter stenosis proximal right internal carotid artery. 42% diameter stenosis proximal left internal carotid artery. Mild narrowing proximal left vertebral artery. Left  vertebral artery is dominant. Mild narrowing origin of the right vertebral artery. Cervical spondylotic changes with kyphosis centered at the C5 level. Various degrees of spinal stenosis and foraminal narrowing. No obvious fracture. Scarring lung apices without mass noted. Top-normal to slightly prominent size upper mediastinal lymph nodes. CTA HEAD Moderate narrowing M1 segment middle cerebral artery greater on the right. Decrease number of visualized middle cerebral artery branches bilaterally with moderate narrowing and irregularity of visualized branches. Cavernous segment internal carotid artery calcified plaque with mild narrowing bilaterally. Fetal type contribution to the left posterior cerebral artery. Right vertebral artery ends in a posterior inferior cerebellar artery distribution. Moderate narrowing and irregularity of the visualized aspect of the right vertebral artery. Calcified plaque left internal carotid artery with mild narrowing. Plaque with mild narrowing basilar artery. Mild to moderate narrowing of portions of the posterior cerebral artery bilaterally. Electronically Signed   By: Lacy DuverneySteven  Olson M.D.   On: 09/15/2015 12:42   Dg Chest Port 1 View  09/14/2015  CLINICAL DATA:  Fall EXAM: PORTABLE CHEST 1 VIEW COMPARISON:  None. FINDINGS: Lungs are clear. Heart is enlarged with pulmonary vascularity within normal limits. No adenopathy. There is a skin fold on the right but no pneumothorax. No fractures are evident. There is degenerative change in the shoulders bilaterally. Atherosclerotic calcification is noted in the aortic arch region. IMPRESSION: No edema or consolidation. There is cardiomegaly. There is a skin fold on the right but no apparent pneumothorax. Electronically Signed   By: Bretta BangWilliam  Woodruff III M.D.   On: 09/14/2015 19:34   Dg Abd Portable 1v  09/16/2015  CLINICAL DATA:  Feeding tube placement. EXAM: PORTABLE ABDOMEN - 1 VIEW COMPARISON:  09/16/2015. FINDINGS: Feeding tube  terminates in the descending duodenum. Bowel gas pattern is unremarkable. Lung bases are grossly clear.  IMPRESSION: Feeding tube terminates in the descending duodenum. Electronically Signed   By: Leanna Battles M.D.   On: 09/16/2015 16:15   Dg Abd Portable 1v  09/16/2015  CLINICAL DATA:  One day history of abdominal pain EXAM: PORTABLE ABDOMEN - 1 VIEW COMPARISON:  None. FINDINGS: There is a mild amount of stool in the colon. There is no bowel dilatation or air-fluid level suggesting obstruction. No free air. There is upper lumbar dextroscoliosis with extensive lumbar arthropathy. Lung bases are clear. Soft tissue prominence in the pelvis likely is due to a distended urinary bladder. IMPRESSION: Probable urinary bladder distention. No bowel obstruction or free air evident. Extensive scoliosis and arthropathy in the lumbar spine region. Electronically Signed   By: Bretta Bang III M.D.   On: 09/16/2015 14:25    ASSESSMENT AND PLAN  1. PAF - she is currently out of rhythm. Her rate is slightly increased. Would use low dose toprol for rate control. I'll defer decision on anti-coagulation to neuro service. Her altered mentation and advanced age a poor prognostic sign. Please call for cardiology questions. 2. Sinus node dysfunction - pauses are minimal and we would not recommend any additional eval. 3. HTN - her blood pressure is controlled.   Treylin Burtch,M.D.  09/17/2015 1:21 PM

## 2015-09-17 NOTE — Progress Notes (Signed)
Bladder scanned patient at 5:30 and had 77ml residual; not enough to intervene; will continue to monitor

## 2015-09-17 NOTE — Progress Notes (Signed)
Code was called on patient while in another room. Rapid response nurse at bedside with the code team in place. Patient's  HR still elevated. waiting for new orders from attending. Patient responding to voice with moaning response only. Code was cancelled and RR nurse still at bedside with patient's nurse monitoring patient's status. New orders received and given. Will continue to monitor.

## 2015-09-17 NOTE — Progress Notes (Signed)
Bladder scanned patient she was retaining 323ml of ine.  In/Out foley and removed .  According to report this is the third time the patient has been cathed in 24 hours.  Will continue to monitor for retention, if need be will insert a foley.

## 2015-09-17 NOTE — Progress Notes (Signed)
[facility] Progress Note  Patient: Alexis Madden WGN:[id number]   PCP: No primary care provider on file. DOB: [birth year]   DOA: 09/14/2015   DOS: 09/17/2015   Date of Service: the patient was seen and examined on 09/17/2015  Subjective:patient continues to remain lethargic, overnight CODE BLUE was called on the patient although patient did not receive any ACLS medications since patient was only having A. Fib with RVR. Patient did receive Cardizem bolus followed by Lopressor bolus with which her rate was well controlled. Cardiology was consulted.  Nutrition: Nothing by mouth at present On-tube feeding  Brief hospital course: Patient was admitted on 09/14/2015, with complaint of fall, was found to have right parietal stroke.since admission the patient has remained lethargic and is not following any command. No meaningful recovery has been identified in her neurological exam. Also found to be having A. Fib with RVR and had an RRT called on 09/16/2015. Currently further plan is further stroke workup and therapy.  Assessment and Plan: 1. Acute CVA (cerebrovascular accident) [facility]) The patient presented with a fall. CT scan on admission showed acute right parietal lobe infarct with cytotoxic edema. Repeat CT scan this morning shows moderate to large infarction on the right side with mild local mass effect. Also left frontal scalp hematoma.  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 Patient has known history of carotid artery disease and CT neck shows 64% right ICA stenosis of 42% left ICA stenosis. Speech therapy recommends to maintain the patient nothing by mouth at present due to inability to follow command. Physical therapy recommends SNF. Occupational therapy recommends SNF as well Currently on aspirin suppository 300 mg.  Monitor on telemetry. Echocardiogram shows EF of 60-65% with grade 2 diastolic dysfunction without any significant valvular  disease. Neurology consult appreciated. I would await recommendation from neurology regarding resuming aspirin and Plavix as home regimen versus anticoagulation. Although given patient's poor prognosis I feel that patient will be better served by hospice and palliative care consultation. Palliative care has been notified to follow-up on the patient.  2. Essential hypertension. Currently allowing permissive hypertension in the setting of acute stroke. Monitor clinically.   3. Chronic kidney disease stage III. Renal functions appears stable will continue to closely monitor. Neurologic recommends to initiate IV fluids at present. We'll monitor clinically.  4. Urinary retention. In and out cath, insert catheter as needed. We will do bladder scan every 8 hours.  5. Acute encephalopathy. Most likely in the setting of acute CVA. Unsure whether the patient needs EEG. We'll follow neurology recommendation.  6.goals of care discussion. Patient's prognosis is significantly poor given recurrent stroke. She already had residual weakness from her earlier stroke and now she is significantly lethargic and is unable to follow command also unable to pass swallowing evaluation or participated in physical therapy. I discussed with patient's son who is the POA who feels that a consultation with palliative care to have a defined goals of care for the patient is most appropriate. Palliative care has been notified for consult completed.  Activity: physical therapy SNF Bowel regimen: last BM 09/16/2015 DVT Prophylaxis: Lovenox after discussion with neurology Nutrition: npo Advance goals of care discussion: full code, prognosis is significantly guarded at present. Informed the family regarding this as well.  Procedures: Echocardiogram  Consultants: Neurology Antibiotics: Anti-infectives    None       Family Communication: family was present at bedside, at the time of interview. Her son is POA and  Opportunity  was given to ask question and all questions were answered satisfactorily.   Disposition:  Expected discharge date: 09/19/2015 Barriers to safe discharge: Further progression of stroke and workup   Intake/Output Summary (Last 24 hours) at 09/17/15 1758 Last data filed at 09/17/15 1730  Gross per 24 hour  Intake      0 ml  Output    700 ml  Net   -700 ml   Filed Weights   09/14/15 2258  Weight: 76.4 kg (168 lb 6.9 oz)    Objective: Physical Exam: Filed Vitals:   09/17/15 0514 09/17/15 0923 09/17/15 1411 09/17/15 1730  BP: 128/98 137/90 148/71 123/88  Pulse: 99 110 142 119  Temp: 98.1 F (36.7 C) 99 F (37.2 C) 98.4 F (36.9 C) 99.1 F (37.3 C)  TempSrc: Axillary Axillary Axillary Axillary  Resp: Weight:      SpO2: 97% 97% 99% 98%    General: Appear in marked distress, no Rash; Oral Mucosa moist.More lethargic this morning Cardiovascular: S1 and S2 Present, no Murmur Respiratory: Bilateral Air entry present and Clear to Auscultation, no Crackles, on wheezes Abdomen: Bowel Sound present, Soft and no tenderness Extremities: no Pedal edema, no calf tenderness Neurology: profound left sided weakness, not following commands, decrease spontaneous movement of right extremities.   Data Reviewed: CBC:  Recent Labs Lab 09/14/15 1959 09/15/15 0531 09/16/15 0529 09/16/15 2141 09/17/15 0717  WBC 7.8 7.6 8.3 9.8 6.9  NEUTROABS 5.5  --   --   --  3.9  HGB 11.4* 10.2* 11.8* 11.6* 10.8*  HCT 34.9* 31.3* 36.1 34.9* 33.5*  MCV 97.5 98.1 97.6 96.7 97.1  PLT 243 215 245 246 234   Basic Metabolic Panel:  Recent Labs Lab 09/14/15 1959 09/15/15 0531 09/16/15 0529 09/16/15 2141 09/17/15 0717  NA 138 139 139 138 139  K 4.4 3.9 3.7 3.3* 3.5  CL 106 106 101 106 106  CO2 21* 22 22 19* 24  GLUCOSE 129* 118* 130* 159* 134*  BUN CREATININE 1.29* 1.22* 1.19* 1.08* 1.15*  CALCIUM 9.4 8.9 9.2 8.8* 8.4*  MG  --   --   --   --  1.8    Liver Function Tests:  Recent Labs Lab 09/14/15 1959 09/16/15 1423 09/17/15 0717  AST 29 72* 49*  ALT ALKPHOS 110 107 93  BILITOT 0.9 1.2 0.6  PROT 6.9 6.9 5.8*  ALBUMIN 3.6 3.3* 2.8*    Recent Labs Lab 09/16/15 1423  LIPASE 26   No results for input(s): AMMONIA in the last 168 hours.  Cardiac Enzymes:  Recent Labs Lab 09/16/15 2141 09/17/15 0717 09/17/15 0947  TROPONINI 0.05* 0.07* 0.07*    BNP (last 3 results) No results for input(s): BNP in the last 8760 hours.  CBG:  Recent Labs Lab 09/17/15 0027 09/17/15 0407 09/17/15 0727 09/17/15 1107 09/17/15 1624  GLUCAP 150* 121* 138* 150* 142*    No results found for this or any previous visit (from the past 240 hour(s)).   Studies: No results found.   Scheduled Meds: . aspirin  300 mg Rectal Daily   Or  . aspirin  325 mg Oral Daily  . enoxaparin (LOVENOX) injection  40 mg Subcutaneous Q24H  . feeding supplement (PRO-STAT SUGAR FREE 64)  30 mL Per Tube Daily  . metoprolol tartrate  25 mg Per Tube Q6H  . pantoprazole (PROTONIX) IV  40 mg Intravenous QHS  Continuous Infusions: . sodium chloride 50 mL/hr at 09/17/15 1341  . feeding supplement (JEVITY 1.2 CAL) 1,000 mL (09/17/15 1425)   PRN Meds: acetaminophen **OR** acetaminophen  Time spent: 30 minutes  Author: Lynden OxfordPranav Virjean Boman, MD [facility] Pager: [phone] 09/17/2015 5:58 PM  If 7PM-7AM, please contact night-coverage at [email or web address], password [email or web address]

## 2015-09-17 NOTE — Progress Notes (Signed)
CSW made contact with patient's son Lucienne CapersCharlie Mortell to assess and inform of PT and OT recommendation for short term rehab. Per son, he is unable to talk right now however will come to the hospital later this evening to discuss disposition plan. CSW will continue to follow and provide support to patient and family while in the hospital.   Fernande BoydenJoyce Vada Swift, [facility] Ann ArborCSWA Clinical Social Worker [facility] [place] Ph: [phone]

## 2015-09-17 NOTE — Progress Notes (Signed)
Speech Language Pathology Treatment: Dysphagia  Patient Details Name: Alexis Madden MRN: [id number] DOB: 10/25/1925 Today's Date: 09/17/2015 Time: [id number] SLP Time Calculation (min) (ACUTE ONLY): 14 min  Assessment / Plan / Recommendation Clinical Impression  Pt continues to present with lethargy, eyes closed, however responds to multimodal stimuli intermittently, no purposeful command following. No functional manipulation of ice chip or puree boluses following oral care with total cueing. PO's suctioned and oral cavity cleaned. Sitting beside MD who was speaking with pt's family member via phone. Family agreed to Palliative care consult; pt has NGT currently. Will follow short term for improvements and stimulability towards dysphagia and cognitive-linguistic goals.    HPI HPI: 82 y.o. female with h/o CVA x2 with residual dysarthria/aphasia and HTN who presented to ED with s/s of CVA. Per MD note, pt had last stroke 1 year ago and family has been unable to understand her since. CT Head 4/12 acte appearing infarct R parietal lobe with cytotoxic edema. Old infarct involving the L frontal lobe, posterior L temporal lobe and much of L occipital lobe. Small prior infarct in medial R occipital lobe also evident. CXR 4/12 no edema or consolidation. No prior SLP intervention found in chart.      SLP Plan  Continue with current plan of care     Recommendations  Diet recommendations: NPO Medication Administration: Via alternative means             Oral Care Recommendations: Oral care QID Follow up Recommendations: 24 hour supervision/assistance Plan: Continue with current plan of care                    Royce MacadamiaLitaker, Raghad Lorenz Willis 09/17/2015, 10:02 AM  Breck CoonsLisa Willis Lonell FaceLitaker M.Ed [employer]-SLP Pager [phone]

## 2015-09-18 LAB — GLUCOSE, CAPILLARY
GLUCOSE-CAPILLARY: 150 mg/dL — AB (ref 65–99)
GLUCOSE-CAPILLARY: 105 mg/dL — AB (ref 65–99)
GLUCOSE-CAPILLARY: 147 mg/dL — AB (ref 65–99)
Glucose-Capillary: 124 mg/dL — ABNORMAL HIGH (ref 65–99)
Glucose-Capillary: 137 mg/dL — ABNORMAL HIGH (ref 65–99)

## 2015-09-18 LAB — BASIC METABOLIC PANEL
ANION GAP: 8 (ref 5–15)
BUN: 21 mg/dL — ABNORMAL HIGH (ref 6–20)
CALCIUM: 8.3 mg/dL — AB (ref 8.9–10.3)
CHLORIDE: 107 mmol/L (ref 101–111)
CO2: 24 mmol/L (ref 22–32)
Creatinine, Ser: 1.07 mg/dL — ABNORMAL HIGH (ref 0.44–1.00)
GFR calc non Af Amer: 45 mL/min — ABNORMAL LOW (ref >60)
GFR, EST AFRICAN AMERICAN: 52 mL/min — AB (ref >60)
Glucose, Bld: 146 mg/dL — ABNORMAL HIGH (ref 65–99)
Potassium: 3.6 mmol/L (ref 3.5–5.1)
Sodium: 139 mmol/L (ref 135–145)

## 2015-09-18 LAB — CBC
HCT: 33.6 % — ABNORMAL LOW (ref 36.0–46.0)
HEMOGLOBIN: 10.7 g/dL — AB (ref 12.0–15.0)
MCH: 31.4 pg (ref 26.0–34.0)
MCHC: 31.8 g/dL (ref 30.0–36.0)
MCV: 98.5 fL (ref 78.0–100.0)
Platelets: 235 10*3/uL (ref 150–400)
RBC: 3.41 MIL/uL — ABNORMAL LOW (ref 3.87–5.11)
RDW: 13.8 % (ref 11.5–15.5)
WBC: 7.7 10*3/uL (ref 4.0–10.5)

## 2015-09-18 LAB — MAGNESIUM: Magnesium: 1.8 mg/dL (ref 1.7–2.4)

## 2015-09-18 MED ORDER — PRAVASTATIN SODIUM 40 MG PO TABS
40.0000 mg | ORAL_TABLET | Freq: Every day | ORAL | Status: DC
  Administered 2015-09-18 – 2015-09-19 (×2): 40 mg via ORAL
  Filled 2015-09-18 (×2): qty 1

## 2015-09-18 NOTE — Progress Notes (Signed)
STROKE TEAM PROGRESS NOTE   HISTORY OF PRESENT ILLNESS Alexis Madden is an 82 y.o. female with a history of hypertension and hyperlipidemia as well as previous left cerebral infarctions with residual aphasia, brought to the emergency room after being found on the floor of the home [place] and. She was last seen well at about 1:30 PM. She was noted to have ecchymosis and swelling involving the left frontal and periorbital region. She was stuporous and has remained stuporous. CT scan of her head showed an acute right parietal area of hypoattenuation, likely acute infarction, in addition to previous left cerebral infarctions. Patient has been taking aspirin and Plavix daily. She was last known well 1:30 PM on 09/14/2015. Patient was not administered IV t-PA secondary to large acute cerebral infarction demonstrated on CT scan and beyond time window for treatment consideration.  She was admitted for further evaluation and treatment.   SUBJECTIVE (INTERVAL HISTORY) No family members present. The patient voices no complaints. She is fairly lethargic. She does not follow commands.    OBJECTIVE Temp:  [98.3 F (36.8 C)-99.1 F (37.3 C)] 98.8 F (37.1 C) (04/16 1342) Pulse Rate:  [89-128] 126 (04/16 1342) Cardiac Rhythm:  [-] Atrial fibrillation (04/16 0700) Resp:  [18-20] 18 (04/16 1342) BP: (120-150)/(84-96) 139/84 mmHg (04/16 1342) SpO2:  [98 %-99 %] 98 % (04/16 1342)  CBC:  Recent Labs Lab 09/14/15 1959  09/17/15 0717 09/18/15 0238  WBC 7.8  < > 6.9 7.7  NEUTROABS 5.5  --  3.9  --   HGB 11.4*  < > 10.8* 10.7*  HCT 34.9*  < > 33.5* 33.6*  MCV 97.5  < > 97.1 98.5  PLT 243  < > 234 235  < > = values in this interval not displayed.  Basic Metabolic Panel:   Recent Labs Lab 09/17/15 0717 09/18/15 0238  NA 139 139  K 3.5 3.6  CL 106 107  CO2 24 24  GLUCOSE 134* 146*  BUN 19 21*  CREATININE 1.15* 1.07*  CALCIUM 8.4* 8.3*  MG 1.8 1.8    Lipid Panel:     Component Value Date/Time    CHOL 138 09/15/2015 0531   TRIG 73 09/15/2015 0531   HDL 56 09/15/2015 0531   CHOLHDL 2.5 09/15/2015 0531   VLDL 15 09/15/2015 0531   LDLCALC 67 09/15/2015 0531   HgbA1c:  Lab Results  Component Value Date   HGBA1C 6.3* 09/15/2015   Urine Drug Screen: No results found for: LABOPIA, COCAINSCRNUR, LABBENZ, AMPHETMU, THCU, LABBARB    IMAGING  I have personally reviewed the radiological images below and agree with the radiology interpretations.  Ct Head Wo Contrast 09/14/2015   Acute appearing infarct right parietal lobe with cytotoxic edema. Old infarcts involving the left frontal lobe, posterior left temporal lobe, and much of the left occipital lobe. A small prior infarct in the medial right occipital lobe is also evident. There is moderate small vessel disease throughout the centra semiovale bilaterally. No intracranial hemorrhage. No extra-axial fluid collections. There is a left frontal scalp hematoma. No fracture evident.   CT HEAD  09/15/2015   Subcutaneous hematoma left supraorbital region without skull fracture or intracranial hemorrhage. Moderate to large acute posterior right frontal -parietal lobe infarct without associated hemorrhage. Mild local mass effect. Remote large left posterior frontal -parietal lobe infarct with encephalomalacia and subsequent dilation left lateral ventricle. Global atrophy. Prominent small vessel disease changes.   CTA NECK  09/15/2015   Mild to slightly moderate narrowing proximal  left subclavian artery. Mild narrowing proximal right subclavian artery. Ectatic common carotid arteries. 64% diameter stenosis proximal right internal carotid artery. 42% diameter stenosis proximal left internal carotid artery. Mild narrowing proximal left vertebral artery. Left vertebral artery is dominant. Mild narrowing origin of the right vertebral artery. Cervical spondylotic changes with kyphosis centered at the C5 level. Various degrees of spinal stenosis and  foraminal narrowing. No obvious fracture. Scarring lung apices without mass noted. Top-normal to slightly prominent size upper mediastinal lymph nodes.   CTA HEAD  09/15/2015   Moderate narrowing M1 segment middle cerebral artery greater on the right. Decrease number of visualized middle cerebral artery branches bilaterally with moderate narrowing and irregularity of visualized branches. Cavernous segment internal carotid artery calcified plaque with mild narrowing bilaterally. Fetal type contribution to the left posterior cerebral artery. Right vertebral artery ends in a posterior inferior cerebellar artery distribution. Moderate narrowing and irregularity of the visualized aspect of the right vertebral artery. Calcified plaque left internal carotid artery with mild narrowing. Plaque with mild narrowing basilar artery. Mild to moderate narrowing of portions of the posterior cerebral artery bilaterally.   Dg Chest Port 1 View 09/14/2015    No edema or consolidation. There is cardiomegaly. There is a skin fold on the right but no apparent pneumothorax.   2-D echocardiogram - Left ventricle: The cavity size was normal. Wall thickness was increased in a pattern of moderate LVH. Systolic function was normal. The estimated ejection fraction was in the range of 60% to 65%. Wall motion was normal; there were no regional wall motion abnormalities. Features are consistent with a pseudonormal left ventricular filling pattern, with concomitant abnormal relaxation and increased filling pressure (grade 2 diastolic dysfunction). - Aortic valve: There was mild regurgitation. - Mitral valve: Moderately calcified annulus. Mildly thickened leaflets . The findings are consistent with mild stenosis. There was mild regurgitation. - Right atrium: The atrium was mildly dilated. - Tricuspid valve: There was moderate regurgitation. - Pulmonary arteries: Systolic pressure was moderately increased. PA peak pressure: 48 mm Hg  (S).   LE venous doppler - negative for DVT. No obvious evidence of DVT or baker's cyst.   PHYSICAL EXAM  Temp:  [98.3 F (36.8 C)-99.1 F (37.3 C)] 98.8 F (37.1 C) (04/16 1342) Pulse Rate:  [89-128] 126 (04/16 1342) Resp:  [18-20] 18 (04/16 1342) BP: (120-150)/(84-96) 139/84 mmHg (04/16 1342) SpO2:  [98 %-99 %] 98 % (04/16 1342)  General - Well nourished, well developed, drowsy but arousable.  Ophthalmologic - Fundi not visualized due to noncooperation.  Cardiovascular - irregularly irregular heart rate and rhythm, but on tele concerning for MAT.  Neuro - drowsy but open eyes on voice, fell back to sleep without constant stimulation, global aphasia, not following commands, respond to pain stimulaton with "please". PERRL, no gaze deviation, no neglect, eyes attending to both directions, however, not blinking to visual threat bilaterally. No facial asymmetry, tongue in middle. RUE and RLE as well as LLE spontaneous movement against gravity, LUE flaccid, 3-/5 on pain stimulation. DTR 1+ and no babinski. Not cooperative on exam for sensory, coordination or gait.   ASSESSMENT/PLAN Ms. Alexis Madden is a 82 y.o. female with history of hypertension, hyperlipidemia and previous left brain infarct with residual aphasia found down at home. CT showed a right parietal infarct.  She did not receive IV t-PA due to large infarct demonstrated on CT and beyond time window for treatment consideration.    Stroke:  Moderate to large right frontal-parietal infarct embolic  secondary to unknown source, concerning for afib as tele showed MAT. Depends on pt improvement, will consider further stroke work up, cardiac monitoring or anticoagulation choice later.   Previous infarct felt to be embolic, patient refused cardiac monitoring   Resultant  Left UE paresis, dysphagia, stuporous state  CT head  Large moderate to right frontal-parietal lobe infarct with cytotoxic edema, old left posterior frontal  parietal lobe infarct  CTA head  Diffuse atherosclerosis  CTA neck  Bilateral ICA therosclerosis, no significant stenosis  2D Echo EF 60-65% No source of embolus   LE venous Dopplers - negative for DVT.  LDL  67   HgbA1c 6.3  Lovenox 40 mg sq daily for VTE prophylaxis Diet NPO time specified  aspirin 81 mg daily and clopidogrel 75 mg daily prior to admission, now on aspirin 300 mg suppository daily. Family against PEG tube according to pt wishes, will hold off further stroke work up, cardiac monitoring and anticoagulation choice at this time. Revisit depends on pt improvement.   Ongoing aggressive stroke risk factor management  Therapy recommendations:  SNF recommended  Disposition:  pending ( living independently prior to admission )   Hx of stroke  1st 2 years ago - recovered quickly  2nd one year ago - with residue expressive aphasia  Refused cardiac monitoring at that time  Living independently  Hypertension  Stable today  Permissive hypertension (OK if < 220/120) but gradually normalize in 5-7 days  Hyperlipidemia  Home meds:  mevacor 40  LDL 67, goal < 70  Resumed statin.   Atrial fibrillation  Now documented on telemetry   Other Stroke Risk Factors  Advanced age  History of stroke, last TIA  Family hx stroke (mother)  Atrial fibrillation - not anticoagulated   Other Active Problems  Elevated creatinine  Anemia, hemoglobin 11.8  NPO - Family against PEG - agreeable to NG tube - now with NG tube  Palliative care consult - we will meet again at 5 PM on Friday  Hold anticoagulation for at least 2 weeks secondary to the size of the infarct. Continue to treat with aspirin until then.   Atrial fibrillation documented on telemetry - patient may be a candidate for anticoagulation if SNF planned.  Hospital day # 4   Delton SeeDavid Rinehuls [facility]ingA-C [facility] Pager [phone] 09/18/2015, 2:40 PM  Pauletta BrownsZEYLIKMAN, Alexis Madden     To  contact Stroke Continuity provider, please refer to [email or web address]. After hours, contact General Neurology

## 2015-09-18 NOTE — Progress Notes (Signed)
[facility] Progress Note  Patient: Alexis ReamerMinnie Madden ZOX:[id number]   PCP: No primary care provider on file. DOB: 09/07/1925   DOA: 09/14/2015   DOS: 09/18/2015   Date of Service: the patient was seen and examined on 09/18/2015  Subjective: Patient shows increase interaction and follows command. Significant improvement in mental status as well.  Nutrition: Nothing by mouth at present On-tube feeding  Brief hospital course: Patient was admitted on 09/14/2015, with complaint of fall, was found to have right parietal stroke.since admission the patient has remained lethargic and is not following any command. No meaningful recovery has been identified in her neurological exam. Also found to be having A. Fib with RVR and had an RRT called on 09/16/2015. Currently further plan is monitor progress from the stroke  Assessment and Plan: 1. Acute CVA (cerebrovascular accident) [facility](HCC) The patient presented with a fall. CT scan on admission showed acute right parietal lobe infarct with cytotoxic edema. Repeat CT scan this morning shows moderate to large infarction on the right side with mild local mass effect. Also left frontal scalp hematoma.  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 Patient has known history of carotid artery disease and CT neck shows 64% right ICA stenosis of 42% left ICA stenosis. Speech therapy recommends to maintain the patient nothing by mouth at present due to inability to follow command. Physical therapy recommends SNF. Occupational therapy recommends SNF as well Currently on aspirin 325 mg , neurology recommends to continue it for 2 weeks and then discuss regarding anticoagulation. Monitor on telemetry. Echocardiogram shows EF of 60-65% with grade 2 diastolic dysfunction without any significant valvular disease. Neurology consult appreciated. And has showed meaningful recovery in last 24 hours. We will continue to monitor patient and at  present may benefit from SNF  2. Essential hypertension. A. fib with RVR. Initially allowed permissive hypertension. Later on due to rate control requirement patient was started on Lopressor. With the Lopressor blood pressure is well controlled as well as heart rate.   3. Chronic kidney disease stage III. Renal functions appears stable will continue to closely monitor. Neurologic recommends to initiate IV fluids at present. We'll monitor clinically.  4. Urinary retention. He has been inserted. We will revisit tomorrow to decide whether to remove the catheter or not.  5. Acute encephalopathy. Most likely in the setting of acute CVA. Unsure whether the patient needs EEG. We'll follow neurology recommendation.  6.goals of care discussion. Initially the patient was significantly lethargic and not following any command for more than 72 hours, I discussed with patient's son who is the POA who feels that a consultation with palliative care to have a defined goals of care for the patient is most appropriate. At present the patient is showing some meaningful recovery and therefore we will continue current management and anticipate discharge to SNF with palliative care support.  Activity: physical therapy SNF Bowel regimen: last BM 09/16/2015 DVT Prophylaxis: Lovenox after discussion with neurology Nutrition: npo Advance goals of care discussion: DNR/DNI.  Procedures: Echocardiogram  Consultants: Neurology, palliative care Antibiotics: Anti-infectives    None      Family Communication: family was present at bedside, at the time of interview. Her son is POA and Opportunity was given to ask question and all questions were answered satisfactorily.   Disposition:  Expected discharge date: 09/19/2015 Barriers to safe discharge: Further progression of stroke and workup   Intake/Output Summary (Last 24 hours) at 09/18/15 1816 Last data filed at 09/18/15  0543  Gross per 24 hour  Intake       0 ml  Output    400 ml  Net   -400 ml   Filed Weights   09/14/15 2258  Weight: 76.4 kg (168 lb 6.9 oz)    Objective: Physical Exam: Filed Vitals:   09/18/15 0538 09/18/15 0949 09/18/15 1342 09/18/15 1732  BP: 122/92 137/88 139/84 144/88  Pulse: 98 89 126 124  Temp: 98.6 F (37 C) 98.3 F (36.8 C) 98.8 F (37.1 C) 99 F (37.2 C)  TempSrc: Oral Oral Oral Oral  Resp: Weight:      SpO2: 98% 98% 98% 98%    General: Appear in marked distress, no Rash; Oral Mucosa moist.More lethargic this morning Cardiovascular: S1 and S2 Present, no Murmur Respiratory: Bilateral Air entry present and Clear to Auscultation, no Crackles, on wheezes Abdomen: Bowel Sound present, Soft and no tenderness Extremities: no Pedal edema, no calf tenderness Neurology: profound left sided weakness, Following command and showing purposeful movements. Getting out a conversation.  Data Reviewed: CBC:  Recent Labs Lab 09/14/15 1959 09/15/15 0531 09/16/15 0529 09/16/15 2141 09/17/15 0717 09/18/15 0238  WBC 7.8 7.6 8.3 9.8 6.9 7.7  NEUTROABS 5.5  --   --   --  3.9  --   HGB 11.4* 10.2* 11.8* 11.6* 10.8* 10.7*  HCT 34.9* 31.3* 36.1 34.9* 33.5* 33.6*  MCV 97.5 98.1 97.6 96.7 97.1 98.5  PLT 243 215 245 246 234 235   Basic Metabolic Panel:  Recent Labs Lab 09/15/15 0531 09/16/15 0529 09/16/15 2141 09/17/15 0717 09/18/15 0238  NA 139 139 138 139 139  K 3.9 3.7 3.3* 3.5 3.6  CL 106 101 106 106 107  CO2 22 22 19* 24 24  GLUCOSE 118* 130* 159* 134* 146*  BUN 21*  CREATININE 1.22* 1.19* 1.08* 1.15* 1.07*  CALCIUM 8.9 9.2 8.8* 8.4* 8.3*  MG  --   --   --  1.8 1.8   Liver Function Tests:  Recent Labs Lab 09/14/15 1959 09/16/15 1423 09/17/15 0717  AST 29 72* 49*  ALT ALKPHOS 110 107 93  BILITOT 0.9 1.2 0.6  PROT 6.9 6.9 5.8*  ALBUMIN 3.6 3.3* 2.8*    Recent Labs Lab 09/16/15 1423  LIPASE 26   No results for input(s): AMMONIA in the last 168  hours.  Cardiac Enzymes:  Recent Labs Lab 09/16/15 2141 09/17/15 0717 09/17/15 0947  TROPONINI 0.05* 0.07* 0.07*    BNP (last 3 results) No results for input(s): BNP in the last 8760 hours.  CBG:  Recent Labs Lab 09/17/15 2346 09/18/15 0409 09/18/15 0734 09/18/15 1111 09/18/15 1610  GLUCAP 141* 150* 124* 105* 137*    No results found for this or any previous visit (from the past 240 hour(s)).   Studies: No results found.   Scheduled Meds: . aspirin  300 mg Rectal Daily   Or  . aspirin  325 mg Oral Daily  . enoxaparin (LOVENOX) injection  40 mg Subcutaneous Q24H  . feeding supplement (PRO-STAT SUGAR FREE 64)  30 mL Per Tube Daily  . metoprolol tartrate  25 mg Per Tube Q6H  . pantoprazole (PROTONIX) IV  40 mg Intravenous QHS  . pravastatin  40 mg Oral q1800   Continuous Infusions: . sodium chloride 50 mL/hr at 09/18/15 0453  . feeding supplement (JEVITY 1.2 CAL) 1,000 mL (09/17/15 1425)   PRN Meds: acetaminophen **OR**  acetaminophen  Time spent: 30 minutes  Author: Lynden Oxford, MD [facility] Pager: [phone] 09/18/2015 6:16 PM  If 7PM-7AM, please contact night-coverage at [email or web address], password [facility]

## 2015-09-18 NOTE — Progress Notes (Signed)
CSW received phone call back from patient's son Lucienne CapersCharlie Frankland on yesterday. Per Billey Goslingharlie, he does not want to discuss any plans regarding discharge. Son reports patient is not doing well and at this moment he would like CSW to follow up at a later time. CSW expressed understanding and provided CSW contact information.   CSW will continue to follow and provide support to patient and family while in the hospital.   Fernande BoydenJoyce Rossie Scarfone, [facility] Reg Med CtrCSWA  Clinical Social Worker [facility] Ph: [phone]

## 2015-09-18 NOTE — Consult Note (Signed)
Consultation Note Date: 09/18/2015   Patient Name: Alexis Madden  DOB: 1925/10/22  MRN: [id number]  Age / Sex: 82 y.o., female  PCP: No primary care provider on file. Referring Physician: Lavina Hamman, MD  Reason for Consultation: Establishing goals of care  Clinical Assessment/Narrative: Alexis Madden is a 82 year old female with PMHx of stroke admitted with a fall and found to have a right parietal stroke. She may largely lethargic and no meaningful recovery on neurologic exam. Palliative consulted for goals of care.    I met with patient's and son and daughter-in-law. We talked about it is most important to Alexis Madden including her independence and her family.  He also talked about her clinical situation to this point in time. Her family reports that they have seen a significant change in her mental status over the last couple of hours. I also discussed with her bedside nurse who reports Alexis Madden has been much more alert and interactive. She is able to speak a few sentences during my examination and does follow a few simple commands. It does appear this is a significant change from earlier today and over the past several days.  I talked about pathways forward with her son and he would like to plan with continued current care the next 48 hours to assess how much functional status she may regain.  SUMMARY OF RECOMMENDATIONS - Patient is DNR/DNI - Continue with current care. She seems to have had improvement in her mental status and ability to interact in the last few hours. - Plan to meet again on Monday at 5 PM in order to continue conversation and determine goals moving forward at that point in time based upon her clinical course over the next 48 hours.  Code Status/Advance Care Planning: DNR    Code Status Orders        Start     Ordered   09/17/15 0933  Do not attempt resuscitation (DNR)   Continuous      Question Answer Comment  In the event of cardiac or respiratory ARREST Do not call a "code blue"   In the event of cardiac or respiratory ARREST Do not perform Intubation, CPR, defibrillation or ACLS   In the event of cardiac or respiratory ARREST Use medication by any route, position, wound care, and other measures to relive pain and suffering. May use oxygen, suction and manual treatment of airway obstruction as needed for comfort.      09/17/15 0932    Code Status History    Date Active Date Inactive Code Status Order ID Comments User Context   09/14/2015 11:15 PM 09/17/2015  9:32 AM Full Code [id number]  Edwin Dada, MD Inpatient    Advance Directive Documentation        Most Recent Value   Type of Advance Directive  -- [Unsure]   Pre-existing out of facility DNR order (yellow form or pink MOST form)     "MOST" Form in Place?        Other Directives:Advanced Directive  Symptom Management:   Denies symptoms at this time.  Palliative Prophylaxis:   Aspiration, Bowel Regimen, Delirium Protocol and Frequent Pain Assessment  Psycho-social/Spiritual:  Support System: Strong  Prognosis: Unable to determine  Discharge Planning: Be determined   Chief Complaint/ Primary Diagnoses: Present on Admission:  . Hypertension . Acute CVA (cerebrovascular accident) ([place]) . Urinary retention . Protein-calorie malnutrition ([place])  I have reviewed the medical record, interviewed the patient and family, and  examined the patient. The following aspects are pertinent.  Past Medical History  Diagnosis Date  . Stroke ([place])   . Hypertension    Social History   Social History  . Marital Status: Single    Spouse Name: N/A  . Number of Children: N/A  . Years of Education: N/A   Social History Main Topics  . Smoking status: Never Smoker   . Smokeless tobacco: Never Used  . Alcohol Use: No  . Drug Use: No  . Sexual Activity: No   Other Topics Concern  . None   Social  History Narrative   Family History  Problem Relation Age of Onset  . Stroke Mother   . Hypertension Other    Scheduled Meds: . aspirin  300 mg Rectal Daily   Or  . aspirin  325 mg Oral Daily  . enoxaparin (LOVENOX) injection  40 mg Subcutaneous Q24H  . feeding supplement (PRO-STAT SUGAR FREE 64)  30 mL Per Tube Daily  . metoprolol tartrate  25 mg Per Tube Q6H  . pantoprazole (PROTONIX) IV  40 mg Intravenous QHS   Continuous Infusions: . sodium chloride 50 mL/hr at 09/18/15 0453  . feeding supplement (JEVITY 1.2 CAL) 1,000 mL (09/17/15 1425)   PRN Meds:.acetaminophen **OR** acetaminophen Medications Prior to Admission:  Prior to Admission medications   Medication Sig Start Date End Date Taking? Authorizing Provider  amLODipine (NORVASC) 2.5 MG tablet Take 2.5 mg by mouth daily.   Yes Historical Provider, MD  aspirin EC 81 MG tablet Take 162 mg by mouth at bedtime.   Yes Historical Provider, MD  clopidogrel (PLAVIX) 75 MG tablet Take 75 mg by mouth daily.   Yes Historical Provider, MD  Folic Acid-Vit A0-TMA U63 (FOLBEE) 2.5-25-1 MG TABS tablet Take 1 tablet by mouth daily.   Yes Historical Provider, MD  losartan (COZAAR) 100 MG tablet Take 100 mg by mouth daily.   Yes Historical Provider, MD  lovastatin (MEVACOR) 40 MG tablet Take 40 mg by mouth at bedtime.   Yes Historical Provider, MD  metoprolol (LOPRESSOR) 100 MG tablet Take 100 mg by mouth 2 (two) times daily.   Yes Historical Provider, MD   Allergies  Allergen Reactions  . Lisinopril Cough  . Sulfamethoxazole-Trimethoprim Nausea Only  . Sulfamethoxazole Rash    Review of Systems  Unable to assess  Physical Exam  General: Alert, awake, in no acute distress. Speaks a few words but does not participate in full conversation. Appears confused. Does not reliably follow commands.  HEENT: No bruits, no goiter, no JVD Heart: Regular rate and rhythm. No murmur appreciated. Lungs: Fair air movement, clear Abdomen: Soft,  nontender, nondistended, positive bowel sounds.  Ext: No significant edema Skin: Warm and dry Neuro: Not reliably follow commands, does move right side greater than left with minimal movement on left side noted  Vital Signs: BP 122/92 mmHg  Pulse 98  Temp(Src) 98.6 F (37 C) (Oral)  Resp 18  Wt 76.4 kg (168 lb 6.9 oz)  SpO2 98%  SpO2: SpO2: 98 % O2 Device:SpO2: 98 % O2 Flow Rate: .O2 Flow Rate (L/min): 2 L/min  IO: Intake/output summary:  Intake/Output Summary (Last 24 hours) at 09/18/15 0915 Last data filed at 09/18/15 0543  Gross per 24 hour  Intake      0 ml  Output    750 ml  Net   -750 ml    LBM: Last BM Date: 09/15/15 Baseline Weight: Weight: 76.4 kg (168 lb 6.9 oz) Most  recent weight: Weight: 76.4 kg (168 lb 6.9 oz)      Palliative Assessment/Data:    Additional Data Reviewed:  CBC:    Component Value Date/Time   WBC 7.7 09/18/2015 0238   HGB 10.7* 09/18/2015 0238   HCT 33.6* 09/18/2015 0238   PLT 235 09/18/2015 0238   MCV 98.5 09/18/2015 0238   NEUTROABS 3.9 09/17/2015 0717   LYMPHSABS 2.1 09/17/2015 0717   MONOABS 0.7 09/17/2015 0717   EOSABS 0.1 09/17/2015 0717   BASOSABS 0.0 09/17/2015 0717   Comprehensive Metabolic Panel:    Component Value Date/Time   NA 139 09/18/2015 0238   K 3.6 09/18/2015 0238   CL 107 09/18/2015 0238   CO2 24 09/18/2015 0238   BUN 21* 09/18/2015 0238   CREATININE 1.07* 09/18/2015 0238   GLUCOSE 146* 09/18/2015 0238   CALCIUM 8.3* 09/18/2015 0238   AST 49* 09/17/2015 0717   ALT 23 09/17/2015 0717   ALKPHOS 93 09/17/2015 0717   BILITOT 0.6 09/17/2015 0717   PROT 5.8* 09/17/2015 0717   ALBUMIN 2.8* 09/17/2015 0717     Time In: 1825 Time Out: 1920 Time Total: 55 Greater than 50%  of this time was spent counseling and coordinating care related to the above assessment and plan.  Signed by: Micheline Rough, MD  Micheline Rough, MD  09/18/2015, 9:15 AM  Please contact Palliative Medicine Team phone at [phone] for  questions and concerns.

## 2015-09-18 NOTE — Progress Notes (Signed)
RN reassessed patient BP 148/96. Patient is grimacing, groaning. RN will administer scheduled metoprolol, PRN tylenol for pain. RN will continue to monitor and reassess patient.

## 2015-09-19 DIAGNOSIS — I10 Essential (primary) hypertension: Secondary | ICD-10-CM

## 2015-09-19 DIAGNOSIS — I638 Other cerebral infarction: Secondary | ICD-10-CM

## 2015-09-19 DIAGNOSIS — I480 Paroxysmal atrial fibrillation: Secondary | ICD-10-CM | POA: Insufficient documentation

## 2015-09-19 LAB — COMPREHENSIVE METABOLIC PANEL
ALBUMIN: 2.7 g/dL — AB (ref 3.5–5.0)
ALT: 21 U/L (ref 14–54)
AST: 29 U/L (ref 15–41)
Alkaline Phosphatase: 91 U/L (ref 38–126)
Anion gap: 9 (ref 5–15)
BUN: 21 mg/dL — AB (ref 6–20)
CHLORIDE: 107 mmol/L (ref 101–111)
CO2: 24 mmol/L (ref 22–32)
Calcium: 8.5 mg/dL — ABNORMAL LOW (ref 8.9–10.3)
Creatinine, Ser: 0.98 mg/dL (ref 0.44–1.00)
GFR calc Af Amer: 58 mL/min — ABNORMAL LOW (ref >60)
GFR calc non Af Amer: 50 mL/min — ABNORMAL LOW (ref >60)
GLUCOSE: 167 mg/dL — AB (ref 65–99)
POTASSIUM: 3.8 mmol/L (ref 3.5–5.1)
SODIUM: 140 mmol/L (ref 135–145)
Total Bilirubin: 0.6 mg/dL (ref 0.3–1.2)
Total Protein: 5.5 g/dL — ABNORMAL LOW (ref 6.5–8.1)

## 2015-09-19 LAB — CBC
HEMATOCRIT: 35 % — AB (ref 36.0–46.0)
Hemoglobin: 11.4 g/dL — ABNORMAL LOW (ref 12.0–15.0)
MCH: 32.9 pg (ref 26.0–34.0)
MCHC: 32.6 g/dL (ref 30.0–36.0)
MCV: 101.2 fL — AB (ref 78.0–100.0)
Platelets: 226 10*3/uL (ref 150–400)
RBC: 3.46 MIL/uL — ABNORMAL LOW (ref 3.87–5.11)
RDW: 14.2 % (ref 11.5–15.5)
WBC: 9.2 10*3/uL (ref 4.0–10.5)

## 2015-09-19 LAB — GLUCOSE, CAPILLARY
GLUCOSE-CAPILLARY: 134 mg/dL — AB (ref 65–99)
Glucose-Capillary: 129 mg/dL — ABNORMAL HIGH (ref 65–99)
Glucose-Capillary: 132 mg/dL — ABNORMAL HIGH (ref 65–99)
Glucose-Capillary: 138 mg/dL — ABNORMAL HIGH (ref 65–99)
Glucose-Capillary: 140 mg/dL — ABNORMAL HIGH (ref 65–99)
Glucose-Capillary: 145 mg/dL — ABNORMAL HIGH (ref 65–99)
Glucose-Capillary: 159 mg/dL — ABNORMAL HIGH (ref 65–99)

## 2015-09-19 LAB — MAGNESIUM: Magnesium: 1.8 mg/dL (ref 1.7–2.4)

## 2015-09-19 MED ORDER — METOPROLOL TARTRATE 25 MG/10 ML ORAL SUSPENSION
37.5000 mg | Freq: Four times a day (QID) | ORAL | Status: DC
  Administered 2015-09-20 (×3): 37.5 mg
  Filled 2015-09-19 (×4): qty 15

## 2015-09-19 NOTE — Progress Notes (Signed)
   09/18/15 0000 09/18/15 0140 09/18/15 0538  Vitals  BP (!) 148/96 mmHg (!) 120/91 mmHg (!) 122/92 mmHg     09/18/15 0949 09/18/15 1342 09/18/15 1732  Vitals  BP 137/88 mmHg 139/84 mmHg (!) 144/88 mmHg     09/18/15 2151 09/18/15 2232 09/18/15 2335  Vitals  BP (!) 164/127 mmHg (!) 148/94 mmHg (!) 147/95 mmHg     09/19/15 0207 09/19/15 0520 09/19/15 0619  Vitals  BP (!) 158/96 mmHg (!) 146/98 mmHg 134/88 mmHg    Patient received Lopressor 25 mg/4910mL oral suspension via NG tube at 1913 and 2335 on 07/21/15 and 0520 today 07/22/15. Patient pulse ranged 105-120 bpm with frequent decrease to 60-80 bpm. Patient did not sleep well until after 0300. Patient has urinary catheter due to acute urinary retention, foley care provided, patient bladder scan showed <110 mL. Patient speaking more clearly, intermittently able to form sentences. Patient intermittently follows simple commands. Patients baseline unclear.

## 2015-09-19 NOTE — Progress Notes (Signed)
Physical Therapy Treatment Patient Details Name: Alexis ReamerMinnie Madden MRN: [id number] DOB: 1926/01/21 Today's Date: 09/19/2015    History of Present Illness 82 y.o. female with h/o CVA x2 with residual dysarthria/aphasia and HTN who presented to ED with s/s of CVA (per son was found lying on floor). Per MD note, pt had last stroke 1 year ago and family has been unable to understand her since. CT Head 4/12 acte appearing infarct R parietal lobe with cytotoxic edema. Old infarct involving the L frontal lobe, posterior L temporal lobe and much of L occipital lobe. Small prior infarct in medial R occipital lobe also evident    PT Comments    Alexis Madden required mod assist for bed mobility and transfer to the chair.  She presents w/ expressive and receptive aphasia but responds well to gestures and tactile cues.  Pt will benefit from continued skilled PT services to increase functional independence and safety.   Follow Up Recommendations  SNF;Supervision/Assistance - 24 hour     Equipment Recommendations  None recommended by PT    Recommendations for Other Services       Precautions / Restrictions Precautions Precautions: Fall Restrictions Weight Bearing Restrictions: No    Mobility  Bed Mobility Overal bed mobility: Needs Assistance Bed Mobility: Supine to Sit     Supine to sit: Mod assist;HOB elevated Sit to supine: Max assist   General bed mobility comments: Assist to trunk and LEs.  Pt uses bed rail w/ increased time and effort.  Use of bed pad for positioning sitting EOB.  Transfers Overall transfer level: Needs assistance Equipment used: Rolling walker (2 wheeled) Transfers: Sit to/from UGI CorporationStand;Stand Pivot Transfers Sit to Stand: Mod assist Stand pivot transfers: Mod assist       General transfer comment: Gestures and tactile cues to prompt pt w/ mobility.  Assist to boost up to standing and to steady once standing.  Pt unable to hold onto RW w/ Lt UE.  She required mod assist  to steady and to direct hips to chair along w/ tactile cues to Lt LE to pivot.  Pt shuffling her feet.  Ambulation/Gait                 Stairs            Wheelchair Mobility    Modified Rankin (Stroke Patients Only)       Balance Overall balance assessment: Needs assistance Sitting-balance support: Feet supported;Bilateral upper extremity supported Sitting balance-Leahy Scale: Fair Sitting balance - Comments: Close min guard for safety w/ Bil UE supported on bed   Standing balance support: Bilateral upper extremity supported;During functional activity Standing balance-Leahy Scale: Poor Standing balance comment: Requires Bil UE support and assist for static standing                    Cognition Arousal/Alertness: Awake/alert Behavior During Therapy: WFL for tasks assessed/performed Overall Cognitive Status: Difficult to assess                      Exercises      General Comments General comments (skin integrity, edema, etc.): Pt responds well to gestures and tactile cues.  RN and nursing student in room at end of PT session and aware that pt needs to be cleaned up and assisted back to bed.  Communicated to RN that pt is not safe to sit in chair as she continues to slide lower in chair.      Pertinent Vitals/Pain  Pain Assessment: Faces Faces Pain Scale: Hurts even more Pain Location: pt unable to indicate, restless in bed and moaning Pain Descriptors / Indicators: Grimacing;Moaning Pain Intervention(s): Limited activity within patient's tolerance;Monitored during session;Repositioned    Home Living                      Prior Function            PT Goals (current goals can now be found in the care plan section) Acute Rehab PT Goals Patient Stated Goal: unable to state Progress towards PT goals: Progressing toward goals    Frequency  Min 3X/week    PT Plan Current plan remains appropriate    Co-evaluation              End of Session Equipment Utilized During Treatment: Oxygen;Gait belt Activity Tolerance: Patient limited by fatigue Patient left: in chair;with call bell/phone within reach;with chair alarm set     Time: 1025-1050 PT Time Calculation (min) (ACUTE ONLY): 25 min  Charges:  $Therapeutic Activity: 23-37 mins                    G Codes:      Encarnacion Chu PT, DPT  Pager: [phone] Phone: [phone] 09/19/2015, 1:19 PM

## 2015-09-19 NOTE — Progress Notes (Signed)
[facility] Progress Note  Patient: Alexis Madden WUJ:[id number]   PCP: No primary care provider on file. DOB: 1926-04-04   DOA: 09/14/2015   DOS: 09/19/2015   Date of Service: the patient was seen and examined on 09/19/2015  Subjective: Continues to show improvement in her mentation. Also continues to show some meaningful recovery in her weakness. Nutrition: Nothing by mouth at present On-tube feeding  Brief hospital course: Patient was admitted on 09/14/2015, with complaint of fall, was found to have right parietal stroke.since admission the patient has remained lethargic and is not following any command. No meaningful recovery has been identified in her neurological exam. Also found to be having A. Fib with RVR and had an RRT called on 09/16/2015. Since 09/18/2015 the patient has been showing progression of meaningful recovery. Currently further plan is monitor progress from the stroke  Assessment and Plan: 1. Acute CVA (cerebrovascular accident) [facility](HCC) The patient presented with a fall. CT scan on admission showed acute right parietal lobe infarct with cytotoxic edema. Repeat CT scan this morning shows moderate to large infarction on the right side with mild local mass effect. Also left frontal scalp hematoma.  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 Patient has known history of carotid artery disease and CT neck shows 64% right ICA stenosis of 42% left ICA stenosis. Speech therapy recommends to maintain the patient nothing by mouth at present due to inability to follow command. Physical therapy recommends SNF. Occupational therapy recommends SNF as well Currently on aspirin 325 mg , neurology recommends to continue it for 2 weeks and then discuss regarding anticoagulation. Monitor on telemetry. Echocardiogram shows EF of 60-65% with grade 2 diastolic dysfunction without any significant valvular disease. Neurology consult appreciated. And has  showed meaningful recovery, We will continue to monitor patient and at present may benefit from SNF Reconsult speech therapy.  2. Essential hypertension. A. fib with RVR. Initially allowed permissive hypertension. Later on due to rate control requirement patient was started on Lopressor. With the Lopressor blood pressure is well controlled as well as heart rate. Continue Lopressor at present appreciate cardiology input   3. Chronic kidney disease stage III. Renal functions appears stable will continue to closely monitor. Neurologic recommends to initiate IV fluids at present. We'll monitor clinically.  4. Urinary retention. Foley catheter has been inserted. Continue catheter today.  5. Acute encephalopathy. Most likely in the setting of acute CVA. Unsure whether the patient needs EEG. We'll follow neurology recommendation.  6.goals of care discussion. Initially the patient was significantly lethargic and not following any command for more than 72 hours, I discussed with patient's son who is the POA who feels that a consultation with palliative care to have a defined goals of care for the patient is most appropriate. At present the patient is showing some meaningful recovery and therefore we will continue current management and anticipate discharge to SNF with palliative care support.  Activity: physical therapy SNF Bowel regimen: last BM 09/16/2015 DVT Prophylaxis: Lovenox after discussion with neurology Nutrition: npo Advance goals of care discussion: DNR/DNI.  Procedures: Echocardiogram  Consultants: Neurology, palliative care Antibiotics: Anti-infectives    None      Family Communication: family was present at bedside, at the time of interview. Her son is POA and Opportunity was given to ask question and all questions were answered satisfactorily.   Disposition:  Expected discharge date: 09/20/2015 Barriers to safe discharge: Further progression of stroke and  workup   Intake/Output Summary (  Last 24 hours) at 09/19/15 1945 Last data filed at 09/19/15 1851  Gross per 24 hour  Intake      0 ml  Output   2900 ml  Net  -2900 ml   Filed Weights   09/14/15 2258  Weight: 76.4 kg (168 lb 6.9 oz)    Objective: Physical Exam: Filed Vitals:   09/19/15 0955 09/19/15 1227 09/19/15 1437 09/19/15 1756  BP: 155/97 140/84 159/72 133/101  Pulse: 111  87 94  Temp: 98.9 F (37.2 C)  98.6 F (37 C) 99.2 F (37.3 C)  TempSrc: Oral  Oral Oral  Resp: Weight:      SpO2: 98%  99% 98%    General: Appear in marked distress, no Rash; Oral Mucosa moist.More lethargic this morning Cardiovascular: S1 and S2 Present, no Murmur Respiratory: Bilateral Air entry present and Clear to Auscultation, no Crackles, on wheezes Abdomen: Bowel Sound present, Soft and no tenderness Extremities: no Pedal edema, no calf tenderness Neurology: profound left sided weakness, Following command and showing purposeful movements. Getting out a conversation.  Data Reviewed: CBC:  Recent Labs Lab 09/14/15 1959  09/16/15 0529 09/16/15 2141 09/17/15 0717 09/18/15 0238 09/19/15 0554  WBC 7.8  < > 8.3 9.8 6.9 7.7 9.2  NEUTROABS 5.5  --   --   --  3.9  --   --   HGB 11.4*  < > 11.8* 11.6* 10.8* 10.7* 11.4*  HCT 34.9*  < > 36.1 34.9* 33.5* 33.6* 35.0*  MCV 97.5  < > 97.6 96.7 97.1 98.5 101.2*  PLT 243  < > 245 246 234 235 226  < > = values in this interval not displayed. Basic Metabolic Panel:  Recent Labs Lab 09/16/15 0529 09/16/15 2141 09/17/15 0717 09/18/15 0238 09/19/15 0554  NA 139 138 139 139 140  K 3.7 3.3* 3.5 3.6 3.8  CL 101 106 106 107 107  CO2 22 19* GLUCOSE 130* 159* 134* 146* 167*  BUN 21* 21*  CREATININE 1.19* 1.08* 1.15* 1.07* 0.98  CALCIUM 9.2 8.8* 8.4* 8.3* 8.5*  MG  --   --  1.8 1.8 1.8   Liver Function Tests:  Recent Labs Lab 09/14/15 1959 09/16/15 1423 09/17/15 0717 09/19/15 0554  AST 29 72* 49* 29  ALT  ALKPHOS 110 107 93 91  BILITOT 0.9 1.2 0.6 0.6  PROT 6.9 6.9 5.8* 5.5*  ALBUMIN 3.6 3.3* 2.8* 2.7*    Recent Labs Lab 09/16/15 1423  LIPASE 26   No results for input(s): AMMONIA in the last 168 hours.  Cardiac Enzymes:  Recent Labs Lab 09/16/15 2141 09/17/15 0717 09/17/15 0947  TROPONINI 0.05* 0.07* 0.07*    BNP (last 3 results) No results for input(s): BNP in the last 8760 hours.  CBG:  Recent Labs Lab 09/19/15 0013 09/19/15 0414 09/19/15 0730 09/19/15 1129 09/19/15 1625  GLUCAP 159* 145* 129* 132* 134*    No results found for this or any previous visit (from the past 240 hour(s)).   Studies: No results found.   Scheduled Meds: . aspirin  300 mg Rectal Daily   Or  . aspirin  325 mg Oral Daily  . enoxaparin (LOVENOX) injection  40 mg Subcutaneous Q24H  . feeding supplement (PRO-STAT SUGAR FREE 64)  30 mL Per Tube Daily  . [START ON 09/20/2015] metoprolol tartrate  37.5 mg Per Tube Q6H  . pantoprazole (PROTONIX) IV  40 mg Intravenous QHS  . pravastatin  40 mg Oral q1800   Continuous Infusions: . sodium chloride 50 mL/hr at 09/18/15 0453  . feeding supplement (JEVITY 1.2 CAL) 1,000 mL (09/18/15 1926)   PRN Meds: acetaminophen **OR** acetaminophen  Time spent: 30 minutes  Author: Lynden Oxford, MD [facility] Pager: [phone] 09/19/2015 7:45 PM  If 7PM-7AM, please contact night-coverage at [email or web address], password [facility]

## 2015-09-19 NOTE — Progress Notes (Signed)
Nutrition Follow-up   INTERVENTION:  Continue Jevity 1.2 @ 55 ml/hr via Cortrak NGT with 30 ml Prostat once daily.   Tube feeding regimen provides 1684 kcal (100% of needs), 80 grams of protein, and 1069 ml of H2O.   When IV fluids are discontinued, recommend adding 100 ml free water flushes every 4 hours to provide additional 600 ml for a total of 1669 ml via NGT daily.     NUTRITION DIAGNOSIS:   Inadequate oral intake related to inability to eat, lethargy/confusion as evidenced by NPO status.  Ongoing  GOAL:   Patient will meet greater than or equal to 90% of their needs  Being Met  MONITOR:   TF tolerance, Weight trends, Diet advancement, Labs, Skin, I & O's  REASON FOR ASSESSMENT:   Consult Enteral/tube feeding initiation and management  ASSESSMENT:   82 y.o. female with a history of hypertension and hyperlipidemia as well as previous left cerebral infarctions with residual aphasia, brought to the emergency room after being found on the floor of the home.  TF at goal rate at time of visit. Per RN, pt has been tolerating TF well. Pt more alert now. Per family, she has been commenting on the fact that she has not been given anything to eat. Family asking if patient will have a swallow evaluation; question passed along to RN who will investigate.   Labs: low calcium, low albumin, glucose ranging 105 to 159 mg/dL  Diet Order:  Diet NPO time specified  Skin:  Reviewed, no issues  Last BM:  4/13  Height:   Ht Readings from Last 1 Encounters:  No data found for Ht    Weight:   Wt Readings from Last 1 Encounters:  09/14/15 168 lb 6.9 oz (76.4 kg)    Ideal Body Weight:     BMI:  There is no height on file to calculate BMI.  Estimated Nutritional Needs:   Kcal:  1600-1800  Protein:  80-90 grams  Fluid:  1.6-1.8 L/day  EDUCATION NEEDS:   No education needs identified at this time  [place], LDN Inpatient Clinical Dietitian Pager:  [phone] After Hours Pager: [phone]

## 2015-09-19 NOTE — Progress Notes (Signed)
SUBJECTIVE:  Sleepy.  Denies chest pain or shortness of breath   PHYSICAL EXAM Filed Vitals:   09/19/15 0619 09/19/15 0955 09/19/15 1227 09/19/15 1437  BP: 134/88 155/97 140/84 159/72  Pulse: 76 111  87  Temp: 98.8 F (37.1 C) 98.9 F (37.2 C)  98.6 F (37 C)  TempSrc: Axillary Oral  Oral  Resp: 16 22  20   Weight:      SpO2: 97% 98%  99%   General:  Sleepy.  No acute distress Neck: No JVD Lungs:  CTAB.  No crackles, rhonchi or wheezes.  Heart:  Irregularly irregular. No m/r/g Abdomen:  Soft, NT, ND.  +BS Extremities:  WWP. No edema  LABS: Lab Results  Component Value Date   TROPONINI 0.07* 09/17/2015   Results for orders placed or performed during the hospital encounter of 09/14/15 (from the past 24 hour(s))  Glucose, capillary     Status: Abnormal   Collection Time: 09/18/15  8:08 PM  Result Value Ref Range   Glucose-Capillary 147 (H) 65 - 99 mg/dL   Comment 1 Notify RN    Comment 2 Document in Chart   Glucose, capillary     Status: Abnormal   Collection Time: 09/19/15 12:13 AM  Result Value Ref Range   Glucose-Capillary 159 (H) 65 - 99 mg/dL   Comment 1 Notify RN    Comment 2 Document in Chart   Glucose, capillary     Status: Abnormal   Collection Time: 09/19/15  4:14 AM  Result Value Ref Range   Glucose-Capillary 145 (H) 65 - 99 mg/dL   Comment 1 Notify RN    Comment 2 Document in Chart   Comprehensive metabolic panel     Status: Abnormal   Collection Time: 09/19/15  5:54 AM  Result Value Ref Range   Sodium 140 135 - 145 mmol/L   Potassium 3.8 3.5 - 5.1 mmol/L   Chloride 107 101 - 111 mmol/L   CO2 24 22 - 32 mmol/L   Glucose, Bld 167 (H) 65 - 99 mg/dL   BUN 21 (H) 6 - 20 mg/dL   Creatinine, Ser 1.610.98 0.44 - 1.00 mg/dL   Calcium 8.5 (L) 8.9 - 10.3 mg/dL   Total Protein 5.5 (L) 6.5 - 8.1 g/dL   Albumin 2.7 (L) 3.5 - 5.0 g/dL   AST 29 15 - 41 U/L   ALT 21 14 - 54 U/L   Alkaline Phosphatase 91 38 - 126 U/L   Total Bilirubin 0.6 0.3 - 1.2 mg/dL   GFR calc non Af Amer 50 (L) >60 mL/min   GFR calc Af Amer 58 (L) >60 mL/min   Anion gap 9 5 - 15  CBC     Status: Abnormal   Collection Time: 09/19/15  5:54 AM  Result Value Ref Range   WBC 9.2 4.0 - 10.5 K/uL   RBC 3.46 (L) 3.87 - 5.11 MIL/uL   Hemoglobin 11.4 (L) 12.0 - 15.0 g/dL   HCT 09.635.0 (L) 04.536.0 - 40.946.0 %   MCV 101.2 (H) 78.0 - 100.0 fL   MCH 32.9 26.0 - 34.0 pg   MCHC 32.6 30.0 - 36.0 g/dL   RDW 81.114.2 91.411.5 - 78.215.5 %   Platelets 226 150 - 400 K/uL  Magnesium     Status: None   Collection Time: 09/19/15  5:54 AM  Result Value Ref Range   Magnesium 1.8 1.7 - 2.4 mg/dL  Glucose, capillary     Status: Abnormal  Collection Time: 09/19/15  7:30 AM  Result Value Ref Range   Glucose-Capillary 129 (H) 65 - 99 mg/dL  Glucose, capillary     Status: Abnormal   Collection Time: 09/19/15 11:29 AM  Result Value Ref Range   Glucose-Capillary 132 (H) 65 - 99 mg/dL  Glucose, capillary     Status: Abnormal   Collection Time: 09/19/15  4:25 PM  Result Value Ref Range   Glucose-Capillary 134 (H) 65 - 99 mg/dL    Intake/Output Summary (Last 24 hours) at 09/19/15 1715 Last data filed at 09/19/15 0622  Gross per 24 hour  Intake      0 ml  Output   1400 ml  Net  -1400 ml   Telemetry: Atrial fibrillation.  Rate 80s-110s.  ASSESSMENT AND PLAN:  Principal Problem:   Acute CVA (cerebrovascular accident) [facility]) Active Problems:   History of stroke   Hypertension   Urinary retention   Protein-calorie malnutrition (HCC)   Abdominal pain, acute   Encounter for feeding tube placement    Ms. Kissel remains in atrial fibrillation with poorly-controlled rates.  BP is increasing.  We will increase metoprolol to 37.5mg  q6h.  Continue aspirin for anticoagulation.  Given her stroke, would strongly consider anticoagulation for secondary stroke prevention.  This patients CHA2DS2-VASc Score and unadjusted Ischemic Stroke Rate (% per year) is equal to 9.7 % stroke rate/year from a score of 6  Above  score calculated as 1 point each if present [CHF, HTN, DM, Vascular=MI/PAD/Aortic Plaque, Age if 65-74, or Female] Above score calculated as 2 points each if present [Age > 75, or Stroke/TIA/TE]     Willoughby Doell C. Duke Salvia, MD, [facility] 09/19/2015 5:15 PM

## 2015-09-19 NOTE — Progress Notes (Addendum)
Occupational Therapy Treatment Patient Details Name: Alexis Madden MRN: [id number] DOB: 06-Sep-1925 Today's Date: 09/19/2015    History of present illness 82 y.o. female with h/o CVA x2 with residual dysarthria/aphasia and HTN who presented to ED with s/s of CVA (per son was found lying on floor). Per MD note, pt had last stroke 1 year ago and family has been unable to understand her since. CT Head 4/12 acte appearing infarct R parietal lobe with cytotoxic edema. Old infarct involving the L frontal lobe, posterior L temporal lobe and much of L occipital lobe. Small prior infarct in medial R occipital lobe also evident   OT comments  Pt sat EOB and performed ADLs. Pt seemed lethargic in session. Will continue to follow acutely and continue to recommend CIR for rehab.  Follow Up Recommendations  SNF;Supervision/Assistance - 24 hour    Equipment Recommendations  Other (comment) (defer to next venue)    Recommendations for Other Services      Precautions / Restrictions Precautions Precautions: Fall Restrictions Weight Bearing Restrictions: No       Mobility Bed Mobility Overal bed mobility: Needs Assistance Bed Mobility: Supine to Sit;Sit to Supine     Supine to sit: Max assist Sit to supine: Max assist   General bed mobility comments: assist to get LEs back in bed and to scoot HOB. Trendelenburg position used to help scoot HOB.                    Balance  Assist for sitting balance sitting EOB.                                 ADL Overall ADL's : Needs assistance/impaired     Grooming: Wash/dry face;Brushing hair;Moderate assistance (wiped off face with washcloth)   Upper Body Bathing: Maximal assistance;Sitting   Lower Body Bathing: Moderate assistance;Sitting/lateral leans (did not wash all of LB)   Upper Body Dressing : Total assistance;Sitting Upper Body Dressing Details (indicate cue type and reason): managing gown                    General ADL Comments: Sat EOB and performed ADLs. Assist given for balance as well as for functional tasks.      Vision                     Perception     Praxis      Cognition  Lethargic Behavior During Therapy: WFL for tasks assessed/performed Overall Cognitive Status: Difficult to assess due to impaired communication; difficulty following commands; not oriented to place                        Extremity/Trunk Assessment               Exercises     Shoulder Instructions       General Comments      Pertinent Vitals/ Pain       Pain Assessment: Faces Faces Pain Scale: Hurts even more Pain Location: Lt UE with movement; appeared in pain getting back in bed Pain Descriptors / Indicators: Grimacing Pain Intervention(s): Monitored during session;Repositioned  Home Living  Prior Functioning/Environment              Frequency Min 2X/week     Progress Toward Goals  OT Goals(current goals can now be found in the care plan section)  Progress towards OT goals: Progressing toward goals (UB bathing goal)  Acute Rehab OT Goals Patient Stated Goal: not stated OT Goal Formulation: With family Time For Goal Achievement: 09/29/15 Potential to Achieve Goals: Fair ADL Goals Pt Will Perform Grooming: with min guard assist;standing Pt Will Perform Upper Body Bathing: sitting;with supervision Pt Will Perform Lower Body Bathing: with min guard assist;sit to/from stand Pt Will Transfer to Toilet: with min guard assist;ambulating;bedside commode (over toilet) Pt Will Perform Toileting - Clothing Manipulation and hygiene: with min guard assist;sit to/from stand  Plan Discharge plan remains appropriate    Co-evaluation                 End of Session Equipment Utilized During Treatment: Oxygen   Activity Tolerance Patient limited by lethargy   Patient Left in bed;with call bell/phone  within reach;with bed alarm set   Nurse Communication          Time: [id number] OT Time Calculation (min): 18 min  Charges: OT General Charges $OT Visit: 1 Procedure OT Treatments $Self Care/Home Management : 8-22 mins   Earlie RavelingStraub, Lorenzo Pereyra L OTR/L [phone] 09/19/2015, 9:52 AM

## 2015-09-19 NOTE — Progress Notes (Signed)
CSW spoke with patient's son Billey GoslingCharlie regarding patient's social security number for placement reasons. Per son, he will call CSW in the morning and provide number. CSW will continue to follow and provide support to patient and family while in hospital.   Fernande BoydenJoyce Chelsy Parrales, [facility] TempleCSWA Clinical Social Worker [facility] Ph: [phone]

## 2015-09-19 NOTE — Clinical Social Work Note (Signed)
Clinical Social Work Assessment  Patient Details  Name: Alexis Madden MRN: [id number] Date of Birth: August 01, 1925  Date of referral:  09/19/15               Reason for consult:  Facility Placement                Permission sought to share information with:  Case Manager, Family Supports Permission granted to share information::  Yes, Verbal Permission Granted  Name::     Alexis Madden  Agency::     Relationship::  Son  Contact Information:  [phone]  Housing/Transportation Living arrangements for the past 2 months:  Single Family Home Source of Information:  Adult Children Patient Interpreter Needed:  None Criminal Activity/Legal Involvement Pertinent to Current Situation/Hospitalization:  No - Comment as needed Significant Relationships:  Adult Children Lives with:  Self Do you feel safe going back to the place where you live?  Yes Need for family participation in patient care:  Yes (Comment)  Care giving concerns:  Son reports patient has not been doing well. Son reports he would like for the patient to get the help that she needs. However, states the patient has informed him before that she does not want to be on any prolonging measures. Son expressed his concerns regarding the decision. CSW expressed understanding and provide brief counseling to the son.    Social Worker assessment / plan:  CSW received consult by Charity fundraiserN. CSW went to speak with patient regarding the possible need for short term rehab. CSW introduced self and acknowledged the patient, however patient appeared restless and unable to assess. Patient's son Billey GoslingCharlie, his wife Sedalia MutaDiane, and daughter Herbert SetaHeather was at bedside. CSW was provided permission to speak while family is in room. CSW informed family of PT recommendation for short term rehab. Family is agreeable to SNF placement, however is awaiting palliative care meeting at 5pm. CSW was provided permission to fax clinical information out [facility] [place].  [facility]. CSW to complete FL2 for MD signature. CSW to initiate SNF placement process.    Employment status:  Disabled (Comment on whether or not currently receiving Disability) Insurance information:  Managed Medicare PT Recommendations:  Skilled Nursing Facility Information / Referral to community resources:     Patient/Family's Response to care: At this time, family is awaiting palliative care meeting at 5pm. Son understands the importance of having a follow up plan, so he provided CSW with permission to fax clinicals out.   Patient/Family's Understanding of and Emotional Response to Diagnosis, Current Treatment, and Prognosis:  Family was very cooperative with CSW assessment. Family is very involved in patient's care and is aware of current treatment and prognosis. Family is agreeable to disposition plan. Family is appreciative of CSW intervention.    Emotional Assessment Appearance:  Appears stated age Attitude/Demeanor/Rapport:   (Unable to assess) Affect (typically observed):   (Uanble to assess) Orientation:   (uanble to assess) Alcohol / Substance use:  Not Applicable Psych involvement (Current and /or in the community):  No (Comment)  Discharge Needs  Concerns to be addressed:  Discharge Planning Concerns Readmission within the last 30 days:  No Current discharge risk:  Physical Impairment Barriers to Discharge:  Continued Medical Work up   [employer]mont MiningJoyce S Jazzalynn Rhudy, LCSW 09/19/2015, 4:11 PM

## 2015-09-19 NOTE — [place] FL2 (Signed)
[place] MEDICAID FL2 LEVEL OF CARE SCREENING TOOL     IDENTIFICATION  Patient Name: Alexis Madden Birthdate: 1925-10-15 Sex: female Admission Date (Current Location): 09/14/2015  [facility] [place]   Facility and Address:  [facility], [address], [place], [place] [address]      Provider Number: [id number]  Attending Physician Name and Address:  Rolly SalterPranav M Patel, MD  Relative Name and Phone Number:       Current Level of Care: Hospital Recommended Level of Care: Skilled Nursing Facility Prior Approval Number:    Date Approved/Denied:   PASRR Number:    Discharge Plan: Home    Current Diagnoses: Patient Active Problem List   Diagnosis Date Noted  . Urinary retention 09/16/2015  . Protein-calorie malnutrition (HCC) 09/16/2015  . Abdominal pain, acute   . Encounter for feeding tube placement   . Infarction of parietal lobe (HCC) 09/14/2015  . History of stroke 09/14/2015  . Hypertension 09/14/2015  . Acute CVA (cerebrovascular accident) (HCC) 09/14/2015    Orientation RESPIRATION BLADDER Height & Weight     Self  O2 (2 Liters) Incontinent, Indwelling catheter Weight: 168 lb 6.9 oz (76.4 kg) Height:     BEHAVIORAL SYMPTOMS/MOOD NEUROLOGICAL BOWEL NUTRITION STATUS      Incontinent Diet (NPO)  AMBULATORY STATUS COMMUNICATION OF NEEDS Skin   Extensive Assist Verbally Normal                       Personal Care Assistance Level of Assistance  Bathing, Feeding, Dressing Bathing Assistance: Maximum assistance Feeding assistance: Independent Dressing Assistance: Maximum assistance     Functional Limitations Info  Sight, Hearing, Speech Sight Info: Adequate Hearing Info: Adequate Speech Info: Adequate    SPECIAL CARE FACTORS FREQUENCY  PT (By licensed PT), OT (By licensed OT)                    Contractures      Additional Factors Info  Code Status, Allergies Code Status Info: DNR Allergies Info:  Lisinopril, Sulfamethoxazole-trimethoprim, Sulfamethoxazole           Current Medications (09/19/2015):  This is the current hospital active medication list Current Facility-Administered Medications  Medication Dose Route Frequency Provider Last Rate Last Dose  . 0.9 %  sodium chloride infusion   Intravenous Continuous Marvel PlanJindong Xu, MD 50 mL/hr at 09/18/15 0453    . acetaminophen (TYLENOL) tablet 650 mg  650 mg Oral Q4H PRN Alberteen Samhristopher P Danford, MD   650 mg at 09/18/15 0044   Or  . acetaminophen (TYLENOL) suppository 650 mg  650 mg Rectal Q4H PRN Alberteen Samhristopher P Danford, MD   650 mg at 09/15/15 0207  . aspirin suppository 300 mg  300 mg Rectal Daily Alberteen Samhristopher P Danford, MD   300 mg at 09/16/15 1138   Or  . aspirin tablet 325 mg  325 mg Oral Daily Alberteen Samhristopher P Danford, MD   325 mg at 09/19/15 1027  . enoxaparin (LOVENOX) injection 40 mg  40 mg Subcutaneous Q24H Rolly SalterPranav M Patel, MD   40 mg at 09/18/15 1904  . feeding supplement (JEVITY 1.2 CAL) liquid 1,000 mL  1,000 mL Per Tube Continuous Rolly SalterPranav M Patel, MD 55 mL/hr at 09/18/15 1926 1,000 mL at 09/18/15 1926  . feeding supplement (PRO-STAT SUGAR FREE 64) liquid 30 mL  30 mL Per Tube Daily Rolly SalterPranav M Patel, MD   30 mL at 09/18/15 1904  . metoprolol  tartrate (LOPRESSOR) 25 mg/10 mL oral suspension 25 mg  25 mg Per Tube Q6H Jinger Neighbors, NP   25 mg at 09/19/15 1227  . pantoprazole (PROTONIX) injection 40 mg  40 mg Intravenous QHS Marvel Plan, MD   40 mg at 09/18/15 2320  . pravastatin (PRAVACHOL) tablet 40 mg  40 mg Oral q1800 David L Rinehuls, PA-C   40 mg at 09/18/15 1903     Discharge Medications: Please see discharge summary for a list of discharge medications.  Relevant Imaging Results:  Relevant Lab Results:   Additional Information SS#:   Loleta Dicker, LCSW

## 2015-09-19 NOTE — Care Management Important Message (Signed)
Important Message  Patient Details  Name: Alexis ReamerMinnie Madden MRN: [id number] Date of Birth: [birth year]   Medicare Important Message Given:  Yes    Autumm Hattery P Rakeya Glab 09/19/2015, 1:55 PM

## 2015-09-19 NOTE — Care Management Note (Signed)
Case Management Note  Patient Details  Name: Alexis Madden MRN: [id number] Date of Birth: Feb 24, 1926  Subjective/Objective:                    Action/Plan: Pt with Cortrak feeding tube. Family meeting with Palliative care today to establish goals of care. CM following for discharge needs.   Expected Discharge Date:                  Expected Discharge Plan:     In-House Referral:     Discharge planning Services     Post Acute Care Choice:    Choice offered to:     DME Arranged:    DME Agency:     HH Arranged:    HH Agency:     Status of Service:  In process, will continue to follow  Medicare Important Message Given:  Yes Date Medicare IM Given:    Medicare IM give by:    Date Additional Medicare IM Given:    Additional Medicare Important Message give by:     If discussed at Long Length of Stay Meetings, dates discussed:    Additional Comments:  Kermit BaloKelli F Payal Stanforth, RN 09/19/2015, 11:08 AM

## 2015-09-19 NOTE — Progress Notes (Signed)
STROKE TEAM PROGRESS NOTE   HISTORY OF PRESENT ILLNESS Alexis Madden is an 82 y.o. female with a history of hypertension and hyperlipidemia as well as previous left cerebral infarctions with residual aphasia, brought to the emergency room after being found on the floor of the home [place] and. She was last seen well at about 1:30 PM. She was noted to have ecchymosis and swelling involving the left frontal and periorbital region. She was stuporous and has remained stuporous. CT scan of her head showed an acute right parietal area of hypoattenuation, likely acute infarction, in addition to previous left cerebral infarctions. Patient has been taking aspirin and Plavix daily. She was last known well 1:30 PM on 09/14/2015. Patient was not administered IV t-PA secondary to large acute cerebral infarction demonstrated on CT scan and beyond time window for treatment consideration.  She was admitted for further evaluation and treatment.   SUBJECTIVE (INTERVAL HISTORY) No family members present. The patient voices no complaints. She is alert today. She does not follow commands.    OBJECTIVE Temp:  [97.6 F (36.4 C)-99 F (37.2 C)] 98.6 F (37 C) (04/17 1437) Pulse Rate:  [76-124] 87 (04/17 1437) Cardiac Rhythm:  [-] Atrial fibrillation (04/17 0703) Resp:  [16-22] 20 (04/17 1437) BP: (134-164)/(72-127) 159/72 mmHg (04/17 1437) SpO2:  [95 %-99 %] 99 % (04/17 1437)  CBC:  Recent Labs Lab 09/14/15 1959  09/17/15 0717 09/18/15 0238 09/19/15 0554  WBC 7.8  < > 6.9 7.7 9.2  NEUTROABS 5.5  --  3.9  --   --   HGB 11.4*  < > 10.8* 10.7* 11.4*  HCT 34.9*  < > 33.5* 33.6* 35.0*  MCV 97.5  < > 97.1 98.5 101.2*  PLT 243  < > 234 235 226  < > = values in this interval not displayed.  Basic Metabolic Panel:   Recent Labs Lab 09/18/15 0238 09/19/15 0554  NA 139 140  K 3.6 3.8  CL 107 107  CO2 24 24  GLUCOSE 146* 167*  BUN 21* 21*  CREATININE 1.07* 0.98  CALCIUM 8.3* 8.5*  MG 1.8 1.8    Lipid  Panel:     Component Value Date/Time   CHOL 138 09/15/2015 0531   TRIG 73 09/15/2015 0531   HDL 56 09/15/2015 0531   CHOLHDL 2.5 09/15/2015 0531   VLDL 15 09/15/2015 0531   LDLCALC 67 09/15/2015 0531   HgbA1c:  Lab Results  Component Value Date   HGBA1C 6.3* 09/15/2015   Urine Drug Screen: No results found for: LABOPIA, COCAINSCRNUR, LABBENZ, AMPHETMU, THCU, LABBARB    IMAGING  I have personally reviewed the radiological images below and agree with the radiology interpretations.  Ct Head Wo Contrast 09/14/2015   Acute appearing infarct right parietal lobe with cytotoxic edema. Old infarcts involving the left frontal lobe, posterior left temporal lobe, and much of the left occipital lobe. A small prior infarct in the medial right occipital lobe is also evident. There is moderate small vessel disease throughout the centra semiovale bilaterally. No intracranial hemorrhage. No extra-axial fluid collections. There is a left frontal scalp hematoma. No fracture evident.   CT HEAD  09/15/2015   Subcutaneous hematoma left supraorbital region without skull fracture or intracranial hemorrhage. Moderate to large acute posterior right frontal -parietal lobe infarct without associated hemorrhage. Mild local mass effect. Remote large left posterior frontal -parietal lobe infarct with encephalomalacia and subsequent dilation left lateral ventricle. Global atrophy. Prominent small vessel disease changes.   CTA NECK  09/15/2015   Mild to slightly moderate narrowing proximal left subclavian artery. Mild narrowing proximal right subclavian artery. Ectatic common carotid arteries. 64% diameter stenosis proximal right internal carotid artery. 42% diameter stenosis proximal left internal carotid artery. Mild narrowing proximal left vertebral artery. Left vertebral artery is dominant. Mild narrowing origin of the right vertebral artery. Cervical spondylotic changes with kyphosis centered at the C5 level.  Various degrees of spinal stenosis and foraminal narrowing. No obvious fracture. Scarring lung apices without mass noted. Top-normal to slightly prominent size upper mediastinal lymph nodes.   CTA HEAD  09/15/2015   Moderate narrowing M1 segment middle cerebral artery greater on the right. Decrease number of visualized middle cerebral artery branches bilaterally with moderate narrowing and irregularity of visualized branches. Cavernous segment internal carotid artery calcified plaque with mild narrowing bilaterally. Fetal type contribution to the left posterior cerebral artery. Right vertebral artery ends in a posterior inferior cerebellar artery distribution. Moderate narrowing and irregularity of the visualized aspect of the right vertebral artery. Calcified plaque left internal carotid artery with mild narrowing. Plaque with mild narrowing basilar artery. Mild to moderate narrowing of portions of the posterior cerebral artery bilaterally.   Dg Chest Port 1 View 09/14/2015    No edema or consolidation. There is cardiomegaly. There is a skin fold on the right but no apparent pneumothorax.   2-D echocardiogram - Left ventricle: The cavity size was normal. Wall thickness was increased in a pattern of moderate LVH. Systolic function was normal. The estimated ejection fraction was in the range of 60% to 65%. Wall motion was normal; there were no regional wall motion abnormalities. Features are consistent with a pseudonormal left ventricular filling pattern, with concomitant abnormal relaxation and increased filling pressure (grade 2 diastolic dysfunction). - Aortic valve: There was mild regurgitation. - Mitral valve: Moderately calcified annulus. Mildly thickened leaflets . The findings are consistent with mild stenosis. There was mild regurgitation. - Right atrium: The atrium was mildly dilated. - Tricuspid valve: There was moderate regurgitation. - Pulmonary arteries: Systolic pressure was moderately  increased. PA peak pressure: 48 mm Hg (S).   LE venous doppler - negative for DVT. No obvious evidence of DVT or baker's cyst.   PHYSICAL EXAM  Temp:  [97.6 F (36.4 C)-99 F (37.2 C)] 98.6 F (37 C) (04/17 1437) Pulse Rate:  [76-124] 87 (04/17 1437) Resp:  [16-22] 20 (04/17 1437) BP: (134-164)/(72-127) 159/72 mmHg (04/17 1437) SpO2:  [95 %-99 %] 99 % (04/17 1437)  General - Well nourished, well developed, drowsy but arousable.  Ophthalmologic - Fundi not visualized due to noncooperation.  Cardiovascular - irregularly irregular heart rate and rhythm,  Neuro - awake but, global aphasia, not following commands,speech barely comprehensible". PERRL, no gaze deviation, no neglect, eyes attending to both directions, however, not blinking to visual threat bilaterally. No facial asymmetry, tongue in middle. RUE and RLE as well as LLE spontaneous movement against gravity, LUE flaccid, 3-/5 on pain stimulation. DTR 1+ and no babinski. Not cooperative on exam for sensory, coordination or gait.   ASSESSMENT/PLAN Ms. Alexis Madden is a 81 y.o. female with history of hypertension, hyperlipidemia and previous left brain infarct with residual aphasia found down at home. CT showed a right parietal infarct.  She did not receive IV t-PA due to large infarct demonstrated on CT and beyond time window for treatment consideration.    Stroke:  Moderate to large right frontal-parietal infarct embolic secondary to unknown source, concerning for afib as tele showed MAT. Depends  on pt improvement, will consider further stroke work up, cardiac monitoring or anticoagulation choice later.   Previous infarct felt to be embolic, patient refused cardiac monitoring   Resultant  Left UE paresis, dysphagia, stuporous state  CT head  Large moderate to right frontal-parietal lobe infarct with cytotoxic edema, old left posterior frontal parietal lobe infarct  CTA head  Diffuse atherosclerosis  CTA neck  Bilateral ICA  therosclerosis, no significant stenosis  2D Echo EF 60-65% No source of embolus   LE venous Dopplers - negative for DVT.  LDL  67   HgbA1c 6.3  Lovenox 40 mg sq daily for VTE prophylaxis Diet NPO time specified  aspirin 81 mg daily and clopidogrel 75 mg daily prior to admission, now on aspirin 300 mg suppository daily. Family against PEG tube according to pt wishes, will hold off further stroke work up, cardiac monitoring and anticoagulation choice at this time. Revisit depends on pt improvement.   Ongoing aggressive stroke risk factor management  Therapy recommendations:  SNF recommended  Disposition:  pending ( living independently prior to admission )   Hx of stroke  1st 2 years ago - recovered quickly  2nd one year ago - with residue expressive aphasia  Refused cardiac monitoring at that time  Living independently  Hypertension  Stable today  Permissive hypertension (OK if < 220/120) but gradually normalize in 5-7 days  Hyperlipidemia  Home meds:  mevacor 40  LDL 67, goal < 70  Resumed statin.   Atrial fibrillation  Now documented on telemetry   Other Stroke Risk Factors  Advanced age  History of stroke, last TIA  Family hx stroke (mother)  Atrial fibrillation - not anticoagulated   Other Active Problems  Elevated creatinine  Anemia, hemoglobin 11.8  NPO - Family against PEG - agreeable to NG tube - now with NG tube  Palliative care consult - we will meet again today at 5 PM  To discuss goals of care  Hold anticoagulation till discharge  secondary to the size of the infarct. Continue to treat with aspirin until then.   Atrial fibrillation documented on telemetry - patient may be a candidate for anticoagulation if SNF planned.  d[facility]w [facility]      09/19/2015, 2:54 PM  SETHI,PRAMOD     To contact Stroke Continuity provider, please refer to [email or web address]. After hours, contact General Neurology

## 2015-09-20 DIAGNOSIS — Z515 Encounter for palliative care: Secondary | ICD-10-CM | POA: Insufficient documentation

## 2015-09-20 DIAGNOSIS — Z7189 Other specified counseling: Secondary | ICD-10-CM | POA: Insufficient documentation

## 2015-09-20 LAB — CBC
HCT: 31.7 % — ABNORMAL LOW (ref 36.0–46.0)
Hemoglobin: 10 g/dL — ABNORMAL LOW (ref 12.0–15.0)
MCH: 31.4 pg (ref 26.0–34.0)
MCHC: 31.5 g/dL (ref 30.0–36.0)
MCV: 99.7 fL (ref 78.0–100.0)
PLATELETS: 226 10*3/uL (ref 150–400)
RBC: 3.18 MIL/uL — ABNORMAL LOW (ref 3.87–5.11)
RDW: 13.9 % (ref 11.5–15.5)
WBC: 7.1 10*3/uL (ref 4.0–10.5)

## 2015-09-20 LAB — GLUCOSE, CAPILLARY
GLUCOSE-CAPILLARY: 168 mg/dL — AB (ref 65–99)
Glucose-Capillary: 149 mg/dL — ABNORMAL HIGH (ref 65–99)
Glucose-Capillary: 168 mg/dL — ABNORMAL HIGH (ref 65–99)
Glucose-Capillary: 127 mg/dL — ABNORMAL HIGH (ref 65–99)
Glucose-Capillary: 104 mg/dL — ABNORMAL HIGH (ref 65–99)

## 2015-09-20 LAB — COMPREHENSIVE METABOLIC PANEL
ALK PHOS: 84 U/L (ref 38–126)
ALT: 18 U/L (ref 14–54)
AST: 22 U/L (ref 15–41)
Albumin: 2.4 g/dL — ABNORMAL LOW (ref 3.5–5.0)
Anion gap: 8 (ref 5–15)
BILIRUBIN TOTAL: 0.7 mg/dL (ref 0.3–1.2)
BUN: 22 mg/dL — AB (ref 6–20)
CO2: 27 mmol/L (ref 22–32)
CREATININE: 0.86 mg/dL (ref 0.44–1.00)
Calcium: 8.3 mg/dL — ABNORMAL LOW (ref 8.9–10.3)
Chloride: 105 mmol/L (ref 101–111)
GFR calc Af Amer: >60 mL/min (ref >60)
GFR, EST NON AFRICAN AMERICAN: 59 mL/min — AB (ref >60)
Glucose, Bld: 127 mg/dL — ABNORMAL HIGH (ref 65–99)
Potassium: 4 mmol/L (ref 3.5–5.1)
Sodium: 140 mmol/L (ref 135–145)
TOTAL PROTEIN: 5.2 g/dL — AB (ref 6.5–8.1)

## 2015-09-20 LAB — MAGNESIUM: Magnesium: 1.9 mg/dL (ref 1.7–2.4)

## 2015-09-20 MED ORDER — BIOTENE DRY MOUTH MT LIQD: 15.0000 mL | OROMUCOSAL | Status: DC | PRN

## 2015-09-20 MED ORDER — SODIUM CHLORIDE 0.9% FLUSH: 3.0000 mL | INTRAVENOUS | Status: DC | PRN

## 2015-09-20 MED ORDER — SODIUM CHLORIDE 0.9% FLUSH
3.0000 mL | Freq: Two times a day (BID) | INTRAVENOUS | Status: DC
  Administered 2015-09-20 – 2015-09-21 (×2): 3 mL via INTRAVENOUS

## 2015-09-20 MED ORDER — SODIUM CHLORIDE 0.9 % IV SOLN: 250.0000 mL | INTRAVENOUS | Status: DC | PRN

## 2015-09-20 MED ORDER — HALOPERIDOL LACTATE 5 MG/ML IJ SOLN: 0.5000 mg | INTRAMUSCULAR | Status: DC | PRN

## 2015-09-20 MED ORDER — GLYCOPYRROLATE 0.2 MG/ML IJ SOLN
0.2000 mg | INTRAMUSCULAR | Status: DC | PRN
  Filled 2015-09-20: qty 1

## 2015-09-20 MED ORDER — LORAZEPAM 1 MG PO TABS: 1.0000 mg | ORAL_TABLET | ORAL | Status: DC | PRN

## 2015-09-20 MED ORDER — LORAZEPAM 2 MG/ML PO CONC: 1.0000 mg | ORAL | Status: DC | PRN

## 2015-09-20 MED ORDER — LORAZEPAM 2 MG/ML IJ SOLN: 1.0000 mg | INTRAMUSCULAR | Status: DC | PRN

## 2015-09-20 MED ORDER — MORPHINE SULFATE (CONCENTRATE) 10 MG/0.5ML PO SOLN: 5.0000 mg | ORAL | Status: DC | PRN

## 2015-09-20 MED ORDER — ONDANSETRON HCL 4 MG/2ML IJ SOLN: 4.0000 mg | Freq: Four times a day (QID) | INTRAMUSCULAR | Status: DC | PRN

## 2015-09-20 MED ORDER — HALOPERIDOL 1 MG PO TABS: 0.5000 mg | ORAL_TABLET | ORAL | Status: DC | PRN

## 2015-09-20 MED ORDER — BISACODYL 10 MG RE SUPP: 10.0000 mg | Freq: Every day | RECTAL | Status: DC | PRN

## 2015-09-20 MED ORDER — GLYCOPYRROLATE 1 MG PO TABS
1.0000 mg | ORAL_TABLET | ORAL | Status: DC | PRN
  Filled 2015-09-20: qty 1

## 2015-09-20 MED ORDER — POLYVINYL ALCOHOL 1.4 % OP SOLN
1.0000 [drp] | Freq: Four times a day (QID) | OPHTHALMIC | Status: DC | PRN
  Filled 2015-09-20: qty 15

## 2015-09-20 MED ORDER — ONDANSETRON 4 MG PO TBDP: 4.0000 mg | ORAL_TABLET | Freq: Four times a day (QID) | ORAL | Status: DC | PRN

## 2015-09-20 MED ORDER — MORPHINE SULFATE (PF) 2 MG/ML IV SOLN
1.0000 mg | INTRAVENOUS | Status: DC | PRN
  Administered 2015-09-21: 1 mg via INTRAVENOUS
  Filled 2015-09-20: qty 1

## 2015-09-20 MED ORDER — ACETAMINOPHEN 325 MG PO TABS: 650.0000 mg | ORAL_TABLET | Freq: Four times a day (QID) | ORAL | Status: DC | PRN

## 2015-09-20 MED ORDER — ACETAMINOPHEN 650 MG RE SUPP: 650.0000 mg | Freq: Four times a day (QID) | RECTAL | Status: DC | PRN

## 2015-09-20 MED ORDER — HALOPERIDOL LACTATE 2 MG/ML PO CONC
0.5000 mg | ORAL | Status: DC | PRN
  Filled 2015-09-20: qty 0.3

## 2015-09-20 MED FILL — Medication: Qty: 1 | Status: AC

## 2015-09-20 NOTE — Progress Notes (Signed)
STROKE TEAM PROGRESS NOTE   HISTORY OF PRESENT ILLNESS Alexis Madden is an 82 y.o. female with a history of hypertension and hyperlipidemia as well as previous left cerebral infarctions with residual aphasia, brought to the emergency room after being found on the floor of the home [place] and. She was last seen well at about 1:30 PM. She was noted to have ecchymosis and swelling involving the left frontal and periorbital region. She was stuporous and has remained stuporous. CT scan of her head showed an acute right parietal area of hypoattenuation, likely acute infarction, in addition to previous left cerebral infarctions. Patient has been taking aspirin and Plavix daily. She was last known well 1:30 PM on 09/14/2015. Patient was not administered IV t-PA secondary to large acute cerebral infarction demonstrated on CT scan and beyond time window for treatment consideration.  She was admitted for further evaluation and treatment.   SUBJECTIVE (INTERVAL HISTORY) No family members present. The patient voices no complaints. She is  Drowsy today. She does not follow commands.    OBJECTIVE Temp:  [98.1 F (36.7 C)-99.2 F (37.3 C)] 98.1 F (36.7 C) (04/18 1258) Pulse Rate:  [90-108] 90 (04/18 1258) Cardiac Rhythm:  [-] Atrial fibrillation (04/17 1900) Resp:  [20] 20 (04/18 1258) BP: (133-158)/(70-101) 143/70 mmHg (04/18 1258) SpO2:  [95 %-99 %] 97 % (04/18 1258) Weight:  [173 lb 8 oz (78.7 kg)-174 lb 6.1 oz (79.1 kg)] 173 lb 8 oz (78.7 kg) (04/18 0512)  CBC:  Recent Labs Lab 09/14/15 1959  09/17/15 0717  09/19/15 0554 09/20/15 0637  WBC 7.8  < > 6.9  < > 9.2 7.1  NEUTROABS 5.5  --  3.9  --   --   --   HGB 11.4*  < > 10.8*  < > 11.4* 10.0*  HCT 34.9*  < > 33.5*  < > 35.0* 31.7*  MCV 97.5  < > 97.1  < > 101.2* 99.7  PLT 243  < > 234  < > 226 226  < > = values in this interval not displayed.  Basic Metabolic Panel:   Recent Labs Lab 09/19/15 0554 09/20/15 0637  NA 140 140  K 3.8 4.0   CL 107 105  CO2 24 27  GLUCOSE 167* 127*  BUN 21* 22*  CREATININE 0.98 0.86  CALCIUM 8.5* 8.3*  MG 1.8 1.9    Lipid Panel:     Component Value Date/Time   CHOL 138 09/15/2015 0531   TRIG 73 09/15/2015 0531   HDL 56 09/15/2015 0531   CHOLHDL 2.5 09/15/2015 0531   VLDL 15 09/15/2015 0531   LDLCALC 67 09/15/2015 0531   HgbA1c:  Lab Results  Component Value Date   HGBA1C 6.3* 09/15/2015   Urine Drug Screen: No results found for: LABOPIA, COCAINSCRNUR, LABBENZ, AMPHETMU, THCU, LABBARB    IMAGING  I have personally reviewed the radiological images below and agree with the radiology interpretations.  Ct Head Wo Contrast 09/14/2015   Acute appearing infarct right parietal lobe with cytotoxic edema. Old infarcts involving the left frontal lobe, posterior left temporal lobe, and much of the left occipital lobe. A small prior infarct in the medial right occipital lobe is also evident. There is moderate small vessel disease throughout the centra semiovale bilaterally. No intracranial hemorrhage. No extra-axial fluid collections. There is a left frontal scalp hematoma. No fracture evident.   CT HEAD  09/15/2015   Subcutaneous hematoma left supraorbital region without skull fracture or intracranial hemorrhage. Moderate to large  acute posterior right frontal -parietal lobe infarct without associated hemorrhage. Mild local mass effect. Remote large left posterior frontal -parietal lobe infarct with encephalomalacia and subsequent dilation left lateral ventricle. Global atrophy. Prominent small vessel disease changes.   CTA NECK  09/15/2015   Mild to slightly moderate narrowing proximal left subclavian artery. Mild narrowing proximal right subclavian artery. Ectatic common carotid arteries. 64% diameter stenosis proximal right internal carotid artery. 42% diameter stenosis proximal left internal carotid artery. Mild narrowing proximal left vertebral artery. Left vertebral artery is dominant.  Mild narrowing origin of the right vertebral artery. Cervical spondylotic changes with kyphosis centered at the C5 level. Various degrees of spinal stenosis and foraminal narrowing. No obvious fracture. Scarring lung apices without mass noted. Top-normal to slightly prominent size upper mediastinal lymph nodes.   CTA HEAD  09/15/2015   Moderate narrowing M1 segment middle cerebral artery greater on the right. Decrease number of visualized middle cerebral artery branches bilaterally with moderate narrowing and irregularity of visualized branches. Cavernous segment internal carotid artery calcified plaque with mild narrowing bilaterally. Fetal type contribution to the left posterior cerebral artery. Right vertebral artery ends in a posterior inferior cerebellar artery distribution. Moderate narrowing and irregularity of the visualized aspect of the right vertebral artery. Calcified plaque left internal carotid artery with mild narrowing. Plaque with mild narrowing basilar artery. Mild to moderate narrowing of portions of the posterior cerebral artery bilaterally.   Dg Chest Port 1 View 09/14/2015    No edema or consolidation. There is cardiomegaly. There is a skin fold on the right but no apparent pneumothorax.   2-D echocardiogram - Left ventricle: The cavity size was normal. Wall thickness was increased in a pattern of moderate LVH. Systolic function was normal. The estimated ejection fraction was in the range of 60% to 65%. Wall motion was normal; there were no regional wall motion abnormalities. Features are consistent with a pseudonormal left ventricular filling pattern, with concomitant abnormal relaxation and increased filling pressure (grade 2 diastolic dysfunction). - Aortic valve: There was mild regurgitation. - Mitral valve: Moderately calcified annulus. Mildly thickened leaflets . The findings are consistent with mild stenosis. There was mild regurgitation. - Right atrium: The atrium was mildly  dilated. - Tricuspid valve: There was moderate regurgitation. - Pulmonary arteries: Systolic pressure was moderately increased. PA peak pressure: 48 mm Hg (S).   LE venous doppler - negative for DVT. No obvious evidence of DVT or baker's cyst.   PHYSICAL EXAM  Temp:  [98.1 F (36.7 C)-99.2 F (37.3 C)] 98.1 F (36.7 C) (04/18 1258) Pulse Rate:  [90-108] 90 (04/18 1258) Resp:  [20] 20 (04/18 1258) BP: (133-158)/(70-101) 143/70 mmHg (04/18 1258) SpO2:  [95 %-99 %] 97 % (04/18 1258) Weight:  [173 lb 8 oz (78.7 kg)-174 lb 6.1 oz (79.1 kg)] 173 lb 8 oz (78.7 kg) (04/18 0512)  General - Well nourished, well developed, drowsy but arousable.  Ophthalmologic - Fundi not visualized due to noncooperation.  Cardiovascular - irregularly irregular heart rate and rhythm,  Neuro - drowsy can barely open eyes to stimulation global aphasia, not following commands,speech barely comprehensible. PERRL, no gaze deviation, no neglect, eyes attending to both directions, however, not blinking to visual threat bilaterally. No facial asymmetry, tongue in middle. RUE and RLE as well as LLE spontaneous movement against gravity, LUE flaccid, 3-/5 on pain stimulation. DTR 1+ and no babinski. Not cooperative on exam for sensory, coordination or gait.   ASSESSMENT/PLAN Ms. Mariha Sleeper is a 82 y.o. female  with history of hypertension, hyperlipidemia and previous left brain infarct with residual aphasia found down at home. CT showed a right parietal infarct.  She did not receive IV t-PA due to large infarct demonstrated on CT and beyond time window for treatment consideration.    Stroke:  Moderate to large right frontal-parietal infarct embolic secondary to unknown source, concerning for afib as tele showed MAT.    Previous infarct felt to be embolic, patient refused cardiac monitoring   Resultant  Left UE paresis, dysphagia, stuporous state  CT head  Large moderate to right frontal-parietal lobe infarct with  cytotoxic edema, old left posterior frontal parietal lobe infarct  CTA head  Diffuse atherosclerosis  CTA neck  Bilateral ICA therosclerosis, no significant stenosis  2D Echo EF 60-65% No source of embolus   LE venous Dopplers - negative for DVT.  LDL  67   HgbA1c 6.3  Lovenox 40 mg sq daily for VTE prophylaxis Diet NPO time specified  aspirin 81 mg daily and clopidogrel 75 mg daily prior to admission, now on aspirin 300 mg suppository daily. Family against PEG tube according to pt wishes, will hold off further stroke work up, cardiac monitoring and anticoagulation choice at this time. Revisit depends on pt improvement.   Ongoing aggressive stroke risk factor management  Therapy recommendations:  SNF recommended  Disposition:  pending ( living independently prior to admission )   Hx of stroke  1st 2 years ago - recovered quickly  2nd one year ago - with residue expressive aphasia  Refused cardiac monitoring at that time  Living independently  Hypertension  Stable today  Permissive hypertension (OK if < 220/120) but gradually normalize in 5-7 days  Hyperlipidemia  Home meds:  mevacor 40  LDL 67, goal < 70  Resumed statin.   Atrial fibrillation  Now documented on telemetry   Other Stroke Risk Factors  Advanced age  History of stroke, last TIA  Family hx stroke (mother)  Atrial fibrillation - not anticoagulated   Other Active Problems  Elevated creatinine  Anemia, hemoglobin 11.8  NPO - Family against PEG - agreeable to NG tube - now with NG tube  Palliative care consult - appreciated  Hold anticoagulation till discharge  secondary to the size of the infarct. Continue to treat with aspirin until then.   Atrial fibrillation documented on telemetry - patient may be a candidate for anticoagulation if SNF planned.  d/w Dr Allena KatzPatel He feels patient has again declined today and he plans to talk to son about comfort care. I spoke to the patient's son  over the phone who wants to meet with  me later this afternoon to discuss her prognosis and goals of care   Hospital day # 6      09/20/2015, 2:47 PM  Krystn Dermody     To contact Stroke Continuity provider, please refer to [email or web address]. After hours, contact General Neurology

## 2015-09-20 NOTE — Progress Notes (Signed)
Son at bedside. Discussed pt's decline. He verbalized understanding and states that he knows she needs hospice now. He requested [facility] due to location and previous experience with a sister who passed away there. Spoke with Dr Allena KatzPatel, who is in agreement and expects d/c tomorrow. Called [facility] with referral for [facility]; spoke with intake. Obtained orders for comfort from Dr Phillips OdorGolding.   Donn PieriniMelanie G. Oliver, RN, BSN, [facility] 09/20/2015 3:42 PM Cell [phone] 8:00-4:00 Monday-Friday Office [phone]

## 2015-09-20 NOTE — Progress Notes (Signed)
Daily Progress Note   Patient Name: Alexis Madden       Date: 09/20/2015 DOB: [birth year]  Age: 82 y.o. MRN#: [id number] Attending Physician: Lavina Hamman, MD Primary Care Physician: No primary care provider on file. Admit Date: 09/14/2015  Reason for Consultation/Follow-up: Disposition and Establishing goals of care  Subjective: I met today with Alexis Madden, her son, her daughter, and her grandchildren.  Alexis Madden is much more alert and interactive, she is still having difficulty with dysarthria, but attempts to engage in conversation.  She is sleepy and drifts off during encounter, but she easily awakens and follows simple commands.  We discussed her clinical course to this point in time as well as pathways moving forward. She has continued to improve from perspective of her mentation and weakness.  Her family is in agreement with plan for rehab at SNF to see how much functional status she can regain.  She remains of tube feeds.  Plan for re-eval by speech tomorrow.  Her family is clear that she would not want to pursue PEG tube and she agrees with this.  Therefore, we discussed purpose of evaluation by speech to be to help determine safest diet even if she is at risk for aspiration.  If she is at high risk for aspiration, would still like to pursue feeding for comfort with understanding that she may not be able to adequately sustain nutrition and hydration and aspiration event may occur at any point.  We discussed aspiration at length, and family seems to be understanding that this could be a continued problem moving forward in both the short and long term.   Length of Stay: 6 days  Current Medications: Scheduled Meds:  . aspirin  300 mg Rectal Daily   Or  . aspirin  325 mg Oral Daily  .  enoxaparin (LOVENOX) injection  40 mg Subcutaneous Q24H  . feeding supplement (PRO-STAT SUGAR FREE 64)  30 mL Per Tube Daily  . metoprolol tartrate  37.5 mg Per Tube Q6H  . pantoprazole (PROTONIX) IV  40 mg Intravenous QHS  . pravastatin  40 mg Oral q1800    Continuous Infusions: . sodium chloride 50 mL/hr at 09/20/15 0041  . feeding supplement (JEVITY 1.2 CAL) 1,000 mL (09/20/15 0547)    PRN Meds: acetaminophen **OR** acetaminophen  Physical  Exam: Physical Exam      General: No distress.  Sleepy but arouses easily and participates some in conversation Cardiovascular: S1 and S2 Present, no Murmur Respiratory: Bilateral Air entry present and Clear to Auscultation, no Crackles, on wheezes Abdomen: Bowel Sound present, Soft and no tenderness Extremities: no Pedal edema, no calf tenderness Neurology: left sided weakness, Follows some command and some purposeful movements.  Vital Signs: BP 158/90 mmHg  Pulse 107  Temp(Src) 98.5 F (36.9 C) (Oral)  Resp 20  Ht _0  (1.6 m)  Wt 78.7 kg (173 lb 8 oz)  BMI 30.74 kg/m2  SpO2 99% SpO2: SpO2: 99 % O2 Device: O2 Device: Nasal Cannula O2 Flow Rate: O2 Flow Rate (L/min): 2 L/min  Intake/output summary:  Intake/Output Summary (Last 24 hours) at 09/20/15 0810 Last data filed at 09/19/15 1851  Gross per 24 hour  Intake      0 ml  Output   1500 ml  Net  -1500 ml   LBM: Last BM Date: 09/19/15 Baseline Weight: Weight: 76.4 kg (168 lb 6.9 oz) Most recent weight: Weight: 78.7 kg (173 lb 8 oz)       Palliative Assessment/Data:   Additional Data Reviewed: CBC    Component Value Date/Time   WBC 7.1 09/20/2015 0637   RBC 3.18* 09/20/2015 0637   HGB 10.0* 09/20/2015 0637   HCT 31.7* 09/20/2015 0637   PLT 226 09/20/2015 0637   MCV 99.7 09/20/2015 0637   MCH 31.4 09/20/2015 0637   MCHC 31.5 09/20/2015 0637   RDW 13.9 09/20/2015 0637   LYMPHSABS 2.1 09/17/2015 0717   MONOABS 0.7 09/17/2015 0717   EOSABS 0.1 09/17/2015 0717    BASOSABS 0.0 09/17/2015 0717    CMP     Component Value Date/Time   NA 140 09/19/2015 0554   K 3.8 09/19/2015 0554   CL 107 09/19/2015 0554   CO2 24 09/19/2015 0554   GLUCOSE 167* 09/19/2015 0554   BUN 21* 09/19/2015 0554   CREATININE 0.98 09/19/2015 0554   CALCIUM 8.5* 09/19/2015 0554   PROT 5.5* 09/19/2015 0554   ALBUMIN 2.7* 09/19/2015 0554   AST 29 09/19/2015 0554   ALT 21 09/19/2015 0554   ALKPHOS 91 09/19/2015 0554   BILITOT 0.6 09/19/2015 0554   GFRNONAA 50* 09/19/2015 0554   GFRAA 58* 09/19/2015 0554       Problem List:  Patient Active Problem List   Diagnosis Date Noted  . Paroxysmal atrial fibrillation (HCC)   . Urinary retention 09/16/2015  . Protein-calorie malnutrition ([place]) 09/16/2015  . Abdominal pain, acute   . Encounter for feeding tube placement   . Infarction of parietal lobe ([place]) 09/14/2015  . History of stroke 09/14/2015  . Hypertension 09/14/2015  . Acute CVA (cerebrovascular accident) ([place]) 09/14/2015     Palliative Care Assessment & Plan    1.Code Status:  DNR    Code Status Orders        Start     Ordered   09/17/15 0933  Do not attempt resuscitation (DNR)   Continuous    Question Answer Comment  In the event of cardiac or respiratory ARREST Do not call a "code blue"   In the event of cardiac or respiratory ARREST Do not perform Intubation, CPR, defibrillation or ACLS   In the event of cardiac or respiratory ARREST Use medication by any route, position, wound care, and other measures to relive pain and suffering. May use oxygen, suction and manual treatment of airway obstruction  as needed for comfort.      09/17/15 0932    Code Status History    Date Active Date Inactive Code Status Order ID Comments User Context   09/14/2015 11:15 PM 09/17/2015  9:32 AM Full Code [id number]  Edwin Dada, MD Inpatient    Advance Directive Documentation        Most Recent Value   Type of Advance Directive  -- [Unsure]   Pre-existing  out of facility DNR order (yellow form or pink MOST form)     "MOST" Form in Place?         2. Goals of Care/Additional Recommendations:  She has been showing continued improvement.  Plan for trial of rehab at SNF.    Discussed nutrition at length with family.  No plan for PEG tube.  Speech eval pending.  As no plan for PEG tube, would plan to resume feeds even if risk for aspiration and would appreciate speech recs on diet to minimize risk of aspiration.  Limitations on Scope of Treatment: Initiate Comfort Feeding  Desire for further Chaplaincy support:no  Psycho-social Needs: Caregiving  Support/Resources  3. Symptom Management:      1.Well controlled and denies complaints.  Continue same.  4. Palliative Prophylaxis:   Aspiration  5. Prognosis: Unable to determine  6. Discharge Planning:  [place] for rehab with Palliative care service follow-up.  Her course moving forward will be determined by her nutrition and rehab course moving forward.  Palliative care will therefore be a good resource for patient moving forward.    Care plan was discussed with patient, family, RN  Thank you for allowing the Palliative Medicine Team to assist in the care of this patient.   Time In: 1700 Time Out: 1740 Total Time 40 Prolonged Time Billed No        Micheline Rough, MD  09/20/2015, 8:10 AM  Please contact Palliative Medicine Team phone at [phone] for questions and concerns.

## 2015-09-20 NOTE — Progress Notes (Signed)
Speech Language Pathology Treatment: Dysphagia  Patient Details Name: Alexis ReamerMinnie Madden MRN: [id number] DOB: 20-Dec-1925 Today's Date: 09/20/2015 Time: [id number] SLP Time Calculation (min) (ACUTE ONLY): 8 min  Assessment / Plan / Recommendation Clinical Impression  Skilled treatment session focused on addressing dysphagia goals.  SLP facilitated arousal with upright positioning in bed, lights on and verbal and tactile cues.  Patient responsive to SLP saying her name and would turn head in the direction and mumble; however, was unable to open eyes or following commands without Total assist.  SLP attempted oral care but patient appeared to be resisting opening mouth.  Presence of spoon with ice chip resulted in opening of bilabial seal, but no active oral manipulation or initiation of swallow was observed with trial.  Hand over hand assist was attempted but patient grimaced in pain.  RN reports that patient's arousal fluctuates; as a result asked RN to contact this SLP later today if arousal increases.  As for now continue to recommend NPO.      HPI HPI: 82 y.o. female with h/o CVA x2 with residual dysarthria/aphasia and HTN who presented to ED with s/s of CVA. Per MD note, pt had last stroke 1 year ago and family has been unable to understand her since. CT Head 4/12 acte appearing infarct R parietal lobe with cytotoxic edema. Old infarct involving the L frontal lobe, posterior L temporal lobe and much of L occipital lobe. Small prior infarct in medial R occipital lobe also evident. CXR 4/12 no edema or consolidation. No prior SLP intervention found in chart.      SLP Plan  Continue with current plan of care     Recommendations  Diet recommendations: NPO Medication Administration: Via alternative means             Oral Care Recommendations: Oral care QID Follow up Recommendations: 24 hour supervision/assistance;Skilled Nursing facility Plan: Continue with current plan of care     GO               Charlane FerrettiMelissa Rosina Cressler, M.A., CCC-SLP [phone]   Mickayla Trouten 09/20/2015, 9:42 AM

## 2015-09-20 NOTE — Progress Notes (Signed)
CSW spoke with Jarrett AblesSon Charlie to obtain patient's social security number. CSW submitted for [facility] and it was received. CSW following up with family's preferred facility, and CSW will continue to follow and provide support to patient and family while in hospital.   Fernande BoydenJoyce Auriana Scalia, [facility] Clinical Social Worker [facility] Ph: [phone]

## 2015-09-20 NOTE — Progress Notes (Signed)
[facility] Progress Note  Patient: Alexis Madden ZOX:[id number]   PCP: No primary care provider on file. DOB: 06/20/1925   DOA: 09/14/2015   DOS: 09/20/2015   Date of Service: the patient was seen and examined on 09/20/2015  Subjective: Patient has been significantly lethargic this morning and is not following command. As per RN the patient's mental status has been waxing and waning. Speech therapy is unable to clear the patient for any swallowing. Nutrition: Nothing by mouth at present On-tube feeding  Brief hospital course: Patient was admitted on 09/14/2015, with complaint of fall, was found to have right parietal stroke.since admission the patient has remained lethargic and is not following any command. No meaningful recovery has been identified in her neurological exam. Also found to be having A. Fib with RVR and had an RRT called on 09/16/2015. Since 09/18/2015 the patient has been showing progression of meaningful recovery. On the morning of 09/20/2015 the patient has been having waxing and waning mental status with significant lethargy and reoccurrence of not following any command. Discussed with palliative care as well as family. Plan is to focus and complete comfort and transition her care to inpatient hospice facility. Family has chosen [facility]. We will await for arrangement of bed and requirement on discharge. Currently further plan is continue complete comfort and arrange for inpatient hospice facility  Assessment and Plan: 1. Acute CVA (cerebrovascular accident) [facility]) The patient presented with a fall. CT scan on admission showed acute right parietal lobe infarct with cytotoxic edema. Repeat CT scan this morning shows moderate to large infarction on the right side with mild local mass effect. Also left frontal scalp hematoma.  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 Patient has known history of carotid artery disease and CT neck  shows 64% right ICA stenosis of 42% left ICA stenosis. Speech therapy recommends to maintain the patient nothing by mouth at present due to inability to follow command. Physical therapy recommends SNF. Occupational therapy recommends SNF as well Currently on aspirin 325 mg , neurology recommends to continue it for 2 weeks and then discuss regarding anticoagulation. Monitor on telemetry. Echocardiogram shows EF of 60-65% with grade 2 diastolic dysfunction without any significant valvular disease. Neurology consult appreciated. Initially the patient was showing meaningful recovery but since last 2 days the patient continues to have waxing and waning mental status with not following command. Speech therapy is unable to clear the patient for any oral nutrition. Family after discussion with the palliative care has decided to go for complete comfort care while in the hospital an inpatient hospice facility. Appreciate input from palliative care as well as neurology.  2. Essential hypertension. A. fib with RVR. Initially allowed permissive hypertension. Later on due to rate control requirement patient was started on Lopressor. With the Lopressor blood pressure is well controlled as well as heart rate. appreciate cardiology input   3. Chronic kidney disease stage III. Renal functions appears stable will continue to closely monitor.  4. Urinary retention. Foley catheter has been inserted. Continue catheter end-of-life.  5. Acute encephalopathy. Most likely in the setting of acute CVA.  6.goals of care discussion. Initially the patient was significantly lethargic and not following any command for more than 72 hours, I discussed with patient's son who is the POA who feels that a consultation with palliative care to have a defined goals of care for the patient is most appropriate. It around the patient did show some meaningful recovery but  then was reoccurrence of lethargic and waxing and waning mental  status with not following command and it has been decided that the patient will be transitioned to full comfort and likely will be requiring inpatient hospice  Activity: Bedridden Bowel regimen: last BM 09/16/2015 DVT Prophylaxis: For care Nutrition: npo Advance goals of care discussion: DNR/DNI. Full comfort  Procedures: Echocardiogram  Consultants: Neurology, palliative care Antibiotics: Anti-infectives    None      Family Communication: family was present at bedside, at the time of interview. Her son is POA and Opportunity was given to ask question and all questions were answered satisfactorily.   Disposition:  Expected discharge date: 09/21/2015 Barriers to safe discharge: Symptom control and transition to inpatient hospice facility   Intake/Output Summary (Last 24 hours) at 09/20/15 1800 Last data filed at 09/20/15 1759  Gross per 24 hour  Intake      0 ml  Output   2000 ml  Net  -2000 ml   Filed Weights   09/14/15 2258 09/19/15 2209 09/20/15 0512  Weight: 76.4 kg (168 lb 6.9 oz) 79.1 kg (174 lb 6.1 oz) 78.7 kg (173 lb 8 oz)    Objective: Physical Exam: Filed Vitals:   09/20/15 0512 09/20/15 1011 09/20/15 1258 09/20/15 1753  BP: 158/90 149/86 143/70 154/112  Pulse: 107 108 90 94  Temp: 98.5 F (36.9 C) 98.7 F (37.1 C) 98.1 F (36.7 C) 98.1 F (36.7 C)  TempSrc: Oral Oral Oral Oral  Resp: Height:      Weight: 78.7 kg (173 lb 8 oz)     SpO2: 99% 95% 97% 98%    General: Appear in marked distress, no Rash; Oral Mucosa moist. More lethargic this morning Cardiovascular: S1 and S2 Present, no Murmur Respiratory: Bilateral Air entry present and Clear to Auscultation, no Crackles, no wheezes Abdomen: Bowel Sound present, Soft and no tenderness Extremities: no Pedal edema, no calf tenderness Neurology: profound left sided weakness, not following command  Data Reviewed: CBC:  Recent Labs Lab 09/14/15 1959  09/16/15 2141 09/17/15 0717  09/18/15 0238 09/19/15 0554 09/20/15 0637  WBC 7.8  < > 9.8 6.9 7.7 9.2 7.1  NEUTROABS 5.5  --   --  3.9  --   --   --   HGB 11.4*  < > 11.6* 10.8* 10.7* 11.4* 10.0*  HCT 34.9*  < > 34.9* 33.5* 33.6* 35.0* 31.7*  MCV 97.5  < > 96.7 97.1 98.5 101.2* 99.7  PLT 243  < > 246 234 235 226 226  < > = values in this interval not displayed. Basic Metabolic Panel:  Recent Labs Lab 09/16/15 2141 09/17/15 0717 09/18/15 0238 09/19/15 0554 09/20/15 0637  NA 138 139 139 140 140  K 3.3* 3.5 3.6 3.8 4.0  CL 106 106 107 107 105  CO2 19* GLUCOSE 159* 134* 146* 167* 127*  BUN 16 19 21* 21* 22*  CREATININE 1.08* 1.15* 1.07* 0.98 0.86  CALCIUM 8.8* 8.4* 8.3* 8.5* 8.3*  MG  --  1.8 1.8 1.8 1.9   Liver Function Tests:  Recent Labs Lab 09/14/15 1959 09/16/15 1423 09/17/15 0717 09/19/15 0554 09/20/15 0637  AST 29 72* 49* 29 22  ALT ALKPHOS 110 107 93 91 84  BILITOT 0.9 1.2 0.6 0.6 0.7  PROT 6.9 6.9 5.8* 5.5* 5.2*  ALBUMIN 3.6 3.3* 2.8* 2.7* 2.4*    Recent Labs Lab 09/16/15 1423  LIPASE 26   No results for input(s): AMMONIA in the last 168 hours.  Cardiac Enzymes:  Recent Labs Lab 09/16/15 2141 09/17/15 0717 09/17/15 0947  TROPONINI 0.05* 0.07* 0.07*    BNP (last 3 results) No results for input(s): BNP in the last 8760 hours.  CBG:  Recent Labs Lab 09/19/15 2357 09/20/15 0405 09/20/15 0752 09/20/15 1133 09/20/15 1611  GLUCAP 140* 149* 168* 168* 127*    No results found for this or any previous visit (from the past 240 hour(s)).   Studies: No results found.   Scheduled Meds: . sodium chloride flush  3 mL Intravenous Q12H   Continuous Infusions:   PRN Meds: sodium chloride, acetaminophen **OR** acetaminophen, antiseptic oral rinse, bisacodyl, glycopyrrolate **OR** glycopyrrolate **OR** glycopyrrolate, haloperidol **OR** haloperidol **OR** haloperidol lactate, LORazepam **OR** LORazepam **OR** LORazepam, morphine injection,  morphine CONCENTRATE **OR** morphine CONCENTRATE, ondansetron **OR** ondansetron (ZOFRAN) IV, polyvinyl alcohol, sodium chloride flush  Time spent: 30 minutes  Author: Lynden OxfordPranav Laiya Wisby, MD [facility] Pager: [phone] 09/20/2015 6:00 PM  If 7PM-7AM, please contact night-coverage at [email or web address], password [email or web address]

## 2015-09-20 NOTE — Progress Notes (Signed)
Palliative Medicine RN Note: Dr Allena KatzPatel contacted PMT NP about pt's decline in mental status. PMT RN came to f/u with pt. She only answers only a few yes/no questions and will not open her eyes. MAR reviewed; pt has gotten no sedating medications. Speech attempted to work w pt to find safest diet for comfort feeds, but pt was unable to swallow/process anything and SLP recommends continued NPO (please see M Bowie's note from this am). Plan had been for pt to go to SNF for rehab with Palliative Medicine following there, but if pt is unable to have any PO intake then inpt hospice is a more appropriate plan, as her prognosis is significantly shorter per Dr Laqueta LindenE Golding. Attempted to call son, but there is no answer. SW Al[facility]Joyce [facility]. Will provide my number to RN if he gets here early enough for me to see him.  Donn PieriniMelanie G. Oliver, RN, BSN, [facility] 09/20/2015 3:03 PM Cell [phone] 8:00-4:00 Monday-Friday Office [phone]

## 2015-09-21 MED ORDER — MORPHINE SULFATE (CONCENTRATE) 10 MG/0.5ML PO SOLN: 5.0000 mg | ORAL | Status: AC | PRN

## 2015-09-21 MED ORDER — BISACODYL 10 MG RE SUPP: 10.0000 mg | Freq: Every day | RECTAL | Status: AC | PRN

## 2015-09-21 MED ORDER — HALOPERIDOL 0.5 MG PO TABS: 0.5000 mg | ORAL_TABLET | ORAL | Status: AC | PRN

## 2015-09-21 MED ORDER — GLYCOPYRROLATE 1 MG PO TABS: 1.0000 mg | ORAL_TABLET | ORAL | Status: AC | PRN

## 2015-09-21 MED ORDER — LORAZEPAM 1 MG PO TABS: 1.0000 mg | ORAL_TABLET | ORAL | Status: AC | PRN

## 2015-09-21 MED ORDER — ONDANSETRON 4 MG PO TBDP: 4.0000 mg | ORAL_TABLET | Freq: Four times a day (QID) | ORAL | Status: AC | PRN

## 2015-09-21 NOTE — Discharge Summary (Addendum)
Physician Discharge Summary  Sahvanna Mcmanigal ZOX:[id number] DOB: 04/19/1926 DOA: 09/14/2015  PCP: No primary care provider on file.  Admit date: 09/14/2015 Discharge date: 09/21/2015  Recommendations for Outpatient Follow-up:  1. Pt will be discharged to residential hospice facility   Discharge Diagnoses:  Principal Problem:   Acute CVA (cerebrovascular accident) [facility]) Active Problems:   History of stroke  Discharge Condition: Stable  Diet recommendation: Comfort feeding    Brief hospital course: Patient was admitted on 09/14/2015 after an episode of fall and was found to have right parietal stroke. Since admission the patient has remained lethargic and is not following any command. No meaningful recovery has been identified in her neurological exam. Also found to have A. Fib with RVR and had an RRT called on 09/16/2015. On the morning of 09/20/2015 the patient has been having waxing and waning mental status with significant lethargy. Discussed with palliative care as well as family. Focus on comfort desired.  Assessment and Plan: 1. Acute CVA (cerebrovascular accident) (HCC) - CT scan on admission showed acute right parietal lobe infarct with cytotoxic edema. - Repeat CT scan this morning shows moderate to large infarction on the right side with mild local mass effect. -  - Patient has prior large left posterior frontal infarct with encephalomalacia. - Patient has known history of carotid artery disease and CT neck shows 64% right ICA stenosis of 42% left ICA - remains lethargic, after discussion with family, focus on comfort desired  2. Essential hypertension. - A. fib with RVR. - no further medications as pt lethargic and unable to tolerate PO  - focus on comfort as noted above   3. Chronic kidney disease stage III. - Renal functions stable while inpatient   4. Urinary retention. - Foley catheter has been inserted. Continue catheter end-of-life.  5. Acute encephalopathy. -  unchanged, full comfort   6. Inadequate oral intake - nutritionist evaluated while inpatient    Advance goals of care discussion: DNR/DNI. Full comfort  Procedures: Echocardiogram  Consultants: Neurology, palliative care      Procedures/Studies: Ct Angio Head W/cm &/or Wo Cm  09/15/2015  CLINICAL DATA:  82 year old hypertensive female with altered mental status post fall. Subsequent encounter. EXAM: CT ANGIOGRAPHY HEAD AND NECK TECHNIQUE: Multidetector CT imaging of the head and neck was performed using the standard protocol during bolus administration of intravenous contrast. Multiplanar CT image reconstructions and MIPs were obtained to evaluate the vascular anatomy. Carotid stenosis measurements (when applicable) are obtained utilizing NASCET criteria, using the distal internal carotid diameter as the denominator. CONTRAST:  50 cc Isovue 370. COMPARISON:  09/14/2015 head CT. FINDINGS: CT HEAD Brain: Subcutaneous hematoma left supraorbital region without skull fracture or intracranial hemorrhage. Moderate to large acute posterior right frontal -parietal lobe infarct without associated hemorrhage. Mild local mass effect. Remote large left posterior frontal -parietal lobe infarct with encephalomalacia and subsequent dilation left lateral ventricle. Global atrophy. Prominent small vessel disease changes. No intracranial enhancing lesion. Calvarium and skull base: Negative. Paranasal sinuses: Clear. Orbits: No acute abnormality.  Post lens replacement. CTA NECK Aortic arch: Plaque with 3 vessel arch. Plaque with mild to slightly moderate narrowing proximal left subclavian artery. Plaque with moderate narrowing proximal right subclavian artery. Right carotid system: Ectatic common carotid artery. Plaque carotid bifurcation extending into the internal carotid artery with 64% diameter stenosis proximal right internal carotid artery. Left carotid system: Ectatic left common carotid artery. Plaque left  carotid bifurcation extending into the left internal carotid artery. 42% diameter stenosis proximal  left internal carotid artery. Vertebral arteries:Plaque with mild narrowing proximal left vertebral artery. Left vertebral artery is dominant. Mild narrowing origin of the right vertebral artery. Skeleton: Cervical spondylotic changes with kyphosis centered at the C5 level. Various degrees of spinal stenosis and foraminal narrowing. No obvious fracture. Other neck: Scarring lung apices without mass noted. Top-normal slightly prominent size upper mediastinal lymph nodes. No worrisome neck mass. CTA HEAD Anterior circulation: Moderate narrowing M1 segment middle cerebral artery greater on the right. Decrease number of visualized middle cerebral artery branches bilaterally with a moderate narrowing and irregularity of visualize branches. Cavernous segment internal carotid artery calcified plaque with mild narrowing bilaterally. Fetal type contribution to the left posterior cerebral artery. Posterior circulation: Right vertebral artery ends in a posterior inferior cerebellar artery distribution. Moderate narrowing and irregularity of the visualized aspect of the right vertebral artery. Calcified plaque left internal carotid artery with mild narrowing. Plaque with mild narrowing basilar artery. Mild to moderate narrowing of portions of the posterior cerebral artery bilaterally. Venous sinuses: Patent. Anatomic variants: As above. Delayed phase: As above. IMPRESSION: CT HEAD Subcutaneous hematoma left supraorbital region without skull fracture or intracranial hemorrhage. Moderate to large acute posterior right frontal -parietal lobe infarct without associated hemorrhage. Mild local mass effect. Remote large left posterior frontal -parietal lobe infarct with encephalomalacia and subsequent dilation left lateral ventricle. Global atrophy. Prominent small vessel disease changes. CTA NECK Mild to slightly moderate narrowing  proximal left subclavian artery. Mild narrowing proximal right subclavian artery. Ectatic common carotid arteries. 64% diameter stenosis proximal right internal carotid artery. 42% diameter stenosis proximal left internal carotid artery. Mild narrowing proximal left vertebral artery. Left vertebral artery is dominant. Mild narrowing origin of the right vertebral artery. Cervical spondylotic changes with kyphosis centered at the C5 level. Various degrees of spinal stenosis and foraminal narrowing. No obvious fracture. Scarring lung apices without mass noted. Top-normal to slightly prominent size upper mediastinal lymph nodes. CTA HEAD Moderate narrowing M1 segment middle cerebral artery greater on the right. Decrease number of visualized middle cerebral artery branches bilaterally with moderate narrowing and irregularity of visualized branches. Cavernous segment internal carotid artery calcified plaque with mild narrowing bilaterally. Fetal type contribution to the left posterior cerebral artery. Right vertebral artery ends in a posterior inferior cerebellar artery distribution. Moderate narrowing and irregularity of the visualized aspect of the right vertebral artery. Calcified plaque left internal carotid artery with mild narrowing. Plaque with mild narrowing basilar artery. Mild to moderate narrowing of portions of the posterior cerebral artery bilaterally. Electronically Signed   By: Lacy Duverney M.D.   On: 09/15/2015 12:42   Ct Head Wo Contrast  09/14/2015  CLINICAL DATA:  Altered mental status with apparent fall EXAM: CT HEAD WITHOUT CONTRAST TECHNIQUE: Contiguous axial images were obtained from the base of the skull through the vertex without intravenous contrast. COMPARISON:  None. FINDINGS: There is moderate diffuse atrophy. There is no intracranial mass, hemorrhage, extra-axial fluid collection, or midline shift. There is evidence of prior infarcts involving the posterior temporal and anterior to mid  left occipital lobes as well as in the mid left frontal lobe. There is small vessel disease throughout the centra semiovale bilaterally. There is evidence of a prior small infarct in the posterior medial right occipital lobe. There is an acute appearing infarct in the right parietal lobe with cytotoxic edema in this area. No other acute infarct is evident. The bony calvarium appears intact. There is a left frontal scalp hematoma. The mastoid air cells  are clear. Orbits appear symmetric bilaterally. IMPRESSION: Acute appearing infarct right parietal lobe with cytotoxic edema. Old infarcts involving the left frontal lobe, posterior left temporal lobe, and much of the left occipital lobe. A small prior infarct in the medial right occipital lobe is also evident. There is moderate small vessel disease throughout the centra semiovale bilaterally. No intracranial hemorrhage. No extra-axial fluid collections. There is a left frontal scalp hematoma. No fracture evident. Electronically Signed   By: Bretta BangWilliam  Woodruff III M.D.   On: 09/14/2015 20:45   Ct Angio Neck W/cm &/or Wo/cm  09/15/2015  CLINICAL DATA:  82 year old hypertensive female with altered mental status post fall. Subsequent encounter. EXAM: CT ANGIOGRAPHY HEAD AND NECK TECHNIQUE: Multidetector CT imaging of the head and neck was performed using the standard protocol during bolus administration of intravenous contrast. Multiplanar CT image reconstructions and MIPs were obtained to evaluate the vascular anatomy. Carotid stenosis measurements (when applicable) are obtained utilizing NASCET criteria, using the distal internal carotid diameter as the denominator. CONTRAST:  50 cc Isovue 370. COMPARISON:  09/14/2015 head CT. FINDINGS: CT HEAD Brain: Subcutaneous hematoma left supraorbital region without skull fracture or intracranial hemorrhage. Moderate to large acute posterior right frontal -parietal lobe infarct without associated hemorrhage. Mild local mass  effect. Remote large left posterior frontal -parietal lobe infarct with encephalomalacia and subsequent dilation left lateral ventricle. Global atrophy. Prominent small vessel disease changes. No intracranial enhancing lesion. Calvarium and skull base: Negative. Paranasal sinuses: Clear. Orbits: No acute abnormality.  Post lens replacement. CTA NECK Aortic arch: Plaque with 3 vessel arch. Plaque with mild to slightly moderate narrowing proximal left subclavian artery. Plaque with moderate narrowing proximal right subclavian artery. Right carotid system: Ectatic common carotid artery. Plaque carotid bifurcation extending into the internal carotid artery with 64% diameter stenosis proximal right internal carotid artery. Left carotid system: Ectatic left common carotid artery. Plaque left carotid bifurcation extending into the left internal carotid artery. 42% diameter stenosis proximal left internal carotid artery. Vertebral arteries:Plaque with mild narrowing proximal left vertebral artery. Left vertebral artery is dominant. Mild narrowing origin of the right vertebral artery. Skeleton: Cervical spondylotic changes with kyphosis centered at the C5 level. Various degrees of spinal stenosis and foraminal narrowing. No obvious fracture. Other neck: Scarring lung apices without mass noted. Top-normal slightly prominent size upper mediastinal lymph nodes. No worrisome neck mass. CTA HEAD Anterior circulation: Moderate narrowing M1 segment middle cerebral artery greater on the right. Decrease number of visualized middle cerebral artery branches bilaterally with a moderate narrowing and irregularity of visualize branches. Cavernous segment internal carotid artery calcified plaque with mild narrowing bilaterally. Fetal type contribution to the left posterior cerebral artery. Posterior circulation: Right vertebral artery ends in a posterior inferior cerebellar artery distribution. Moderate narrowing and irregularity of the  visualized aspect of the right vertebral artery. Calcified plaque left internal carotid artery with mild narrowing. Plaque with mild narrowing basilar artery. Mild to moderate narrowing of portions of the posterior cerebral artery bilaterally. Venous sinuses: Patent. Anatomic variants: As above. Delayed phase: As above. IMPRESSION: CT HEAD Subcutaneous hematoma left supraorbital region without skull fracture or intracranial hemorrhage. Moderate to large acute posterior right frontal -parietal lobe infarct without associated hemorrhage. Mild local mass effect. Remote large left posterior frontal -parietal lobe infarct with encephalomalacia and subsequent dilation left lateral ventricle. Global atrophy. Prominent small vessel disease changes. CTA NECK Mild to slightly moderate narrowing proximal left subclavian artery. Mild narrowing proximal right subclavian artery. Ectatic common carotid arteries. 64% diameter  stenosis proximal right internal carotid artery. 42% diameter stenosis proximal left internal carotid artery. Mild narrowing proximal left vertebral artery. Left vertebral artery is dominant. Mild narrowing origin of the right vertebral artery. Cervical spondylotic changes with kyphosis centered at the C5 level. Various degrees of spinal stenosis and foraminal narrowing. No obvious fracture. Scarring lung apices without mass noted. Top-normal to slightly prominent size upper mediastinal lymph nodes. CTA HEAD Moderate narrowing M1 segment middle cerebral artery greater on the right. Decrease number of visualized middle cerebral artery branches bilaterally with moderate narrowing and irregularity of visualized branches. Cavernous segment internal carotid artery calcified plaque with mild narrowing bilaterally. Fetal type contribution to the left posterior cerebral artery. Right vertebral artery ends in a posterior inferior cerebellar artery distribution. Moderate narrowing and irregularity of the visualized  aspect of the right vertebral artery. Calcified plaque left internal carotid artery with mild narrowing. Plaque with mild narrowing basilar artery. Mild to moderate narrowing of portions of the posterior cerebral artery bilaterally. Electronically Signed   By: Lacy Duverney M.D.   On: 09/15/2015 12:42   Dg Chest Port 1 View  09/14/2015  CLINICAL DATA:  Fall EXAM: PORTABLE CHEST 1 VIEW COMPARISON:  None. FINDINGS: Lungs are clear. Heart is enlarged with pulmonary vascularity within normal limits. No adenopathy. There is a skin fold on the right but no pneumothorax. No fractures are evident. There is degenerative change in the shoulders bilaterally. Atherosclerotic calcification is noted in the aortic arch region. IMPRESSION: No edema or consolidation. There is cardiomegaly. There is a skin fold on the right but no apparent pneumothorax. Electronically Signed   By: Bretta Bang III M.D.   On: 09/14/2015 19:34   Dg Abd Portable 1v  09/16/2015  CLINICAL DATA:  Feeding tube placement. EXAM: PORTABLE ABDOMEN - 1 VIEW COMPARISON:  09/16/2015. FINDINGS: Feeding tube terminates in the descending duodenum. Bowel gas pattern is unremarkable. Lung bases are grossly clear. IMPRESSION: Feeding tube terminates in the descending duodenum. Electronically Signed   By: Leanna Battles M.D.   On: 09/16/2015 16:15   Dg Abd Portable 1v  09/16/2015  CLINICAL DATA:  One day history of abdominal pain EXAM: PORTABLE ABDOMEN - 1 VIEW COMPARISON:  None. FINDINGS: There is a mild amount of stool in the colon. There is no bowel dilatation or air-fluid level suggesting obstruction. No free air. There is upper lumbar dextroscoliosis with extensive lumbar arthropathy. Lung bases are clear. Soft tissue prominence in the pelvis likely is due to a distended urinary bladder. IMPRESSION: Probable urinary bladder distention. No bowel obstruction or free air evident. Extensive scoliosis and arthropathy in the lumbar spine region.  Electronically Signed   By: Bretta Bang III M.D.   On: 09/16/2015 14:25    Discharge Exam: Filed Vitals:   09/21/15 0120 09/21/15 0514  BP: 152/74 135/79  Pulse: 96 93  Temp: 97.9 F (36.6 C) 98 F (36.7 C)  Resp: 20 20   Filed Vitals:   09/20/15 1753 09/20/15 2104 09/21/15 0120 09/21/15 0514  BP: 153/71 137/88 152/74 135/79  Pulse: 90 99 96 93  Temp: 98.1 F (36.7 C) 98.7 F (37.1 C) 97.9 F (36.6 C) 98 F (36.7 C)  TempSrc: Oral Oral Oral Oral  Resp: 20 20 20 20   Height:      Weight:  78.971 kg (174 lb 1.6 oz)    SpO2: 98% 99% 98% 99%    General: Pt is lethargic, difficult to awake, NAD Cardiovascular: Regular rate and rhythm, no rubs,  no gallops Respiratory: poor inspiratory effort, diminished breath sounds at bases   Discharge Instructions  Discharge Instructions    Diet - low sodium heart healthy    Complete by:  As directed      Increase activity slowly    Complete by:  As directed             Medication List    STOP taking these medications        amLODipine 2.5 MG tablet  Commonly known as:  NORVASC     aspirin EC 81 MG tablet     clopidogrel 75 MG tablet  Commonly known as:  PLAVIX     Folic Acid-Vit B6-Vit B12 2.5-25-1 MG Tabs tablet  Commonly known as:  FOLBEE     losartan 100 MG tablet  Commonly known as:  COZAAR     lovastatin 40 MG tablet  Commonly known as:  MEVACOR     metoprolol 100 MG tablet  Commonly known as:  LOPRESSOR      TAKE these medications        bisacodyl 10 MG suppository  Commonly known as:  DULCOLAX  Place 1 suppository (10 mg total) rectally daily as needed for moderate constipation.     glycopyrrolate 1 MG tablet  Commonly known as:  ROBINUL  Take 1 tablet (1 mg total) by mouth every 4 (four) hours as needed (excessive secretions).     haloperidol 0.5 MG tablet  Commonly known as:  HALDOL  Take 1 tablet (0.5 mg total) by mouth every 4 (four) hours as needed for agitation (or delirium).      LORazepam 1 MG tablet  Commonly known as:  ATIVAN  Take 1 tablet (1 mg total) by mouth every 4 (four) hours as needed for anxiety.     morphine CONCENTRATE 10 MG/0.5ML Soln concentrated solution  Take 0.25 mLs (5 mg total) by mouth every 2 (two) hours as needed for moderate pain (or dyspnea).     ondansetron 4 MG disintegrating tablet  Commonly known as:  ZOFRAN-ODT  Take 1 tablet (4 mg total) by mouth every 6 (six) hours as needed for nausea.         The results of significant diagnostics from this hospitalization (including imaging, microbiology, ancillary and laboratory) are listed below for reference.     Labs: Basic Metabolic Panel:  Recent Labs Lab 09/16/15 2141 09/17/15 0717 09/18/15 0238 09/19/15 0554 09/20/15 0637  NA 138 139 139 140 140  K 3.3* 3.5 3.6 3.8 4.0  CL 106 106 107 107 105  CO2 19* GLUCOSE 159* 134* 146* 167* 127*  BUN 16 19 21* 21* 22*  CREATININE 1.08* 1.15* 1.07* 0.98 0.86  CALCIUM 8.8* 8.4* 8.3* 8.5* 8.3*  MG  --  1.8 1.8 1.8 1.9   Liver Function Tests:  Recent Labs Lab 09/14/15 1959 09/16/15 1423 09/17/15 0717 09/19/15 0554 09/20/15 0637  AST 29 72* 49* 29 22  ALT ALKPHOS 110 107 93 91 84  BILITOT 0.9 1.2 0.6 0.6 0.7  PROT 6.9 6.9 5.8* 5.5* 5.2*  ALBUMIN 3.6 3.3* 2.8* 2.7* 2.4*    Recent Labs Lab 09/16/15 1423  LIPASE 26   CBC:  Recent Labs Lab 09/14/15 1959  09/16/15 2141 09/17/15 0717 09/18/15 0238 09/19/15 0554 09/20/15 0637  WBC 7.8  < > 9.8 6.9 7.7 9.2 7.1  NEUTROABS 5.5  --   --  3.9  --   --   --  HGB 11.4*  < > 11.6* 10.8* 10.7* 11.4* 10.0*  HCT 34.9*  < > 34.9* 33.5* 33.6* 35.0* 31.7*  MCV 97.5  < > 96.7 97.1 98.5 101.2* 99.7  PLT 243  < > 246 234 235 226 226  < > = values in this interval not displayed. Cardiac Enzymes:  Recent Labs Lab 09/16/15 2141 09/17/15 0717 09/17/15 0947  TROPONINI 0.05* 0.07* 0.07*   CBG:  Recent Labs Lab 09/20/15 0405 09/20/15 0752  09/20/15 1133 09/20/15 1611 09/20/15 1950  GLUCAP 149* 168* 168* 127* 104*   SIGNED: Time coordinating discharge: 30 minutes  MAGICK-Cimone Fahey, MD  [facility] 09/21/2015, 9:11 AM Pager [phone]  If 7PM-7AM, please contact night-coverage [email or web address] Password [email or web address]

## 2015-09-21 NOTE — Progress Notes (Signed)
CS[facility], however received no answer. CSW left voice message at 8:43 am with facility representative Lafonda MossesDiana regarding patient's admission. CSW will continue to follow and provide support to patient and family while in hospital.   Fernande BoydenJoyce Cobie Marcoux, [facility] CenterCSWA Clinical Social Worker [facility] Ph: [phone]

## 2015-09-21 NOTE — Progress Notes (Signed)
CSW spoke with facility representative Caitlyn from [facility] regarding patient admission. Per Caitlyn, they can take the patient on today. CSW was advised to arrange transportation for 11am. CSW to print required paperwork and send with patient via ambulance. CSW will call and inform family and RN. No further needs reported at this time. CSW to sign off.  Please consult if further CSW needs arise.   Alexis Madden, LCSWA Clinical Social Worker [facility] Ph: [phone]

## 2015-09-21 NOTE — Progress Notes (Signed)
Patient discharged to facility via ptar. Vss.

## 2015-09-21 NOTE — Progress Notes (Signed)
Discharge orders received, patient's foley to be kept intact, feeding tube was removed last am per palliative, per hospice, ivs to stay in place, tele removed. Patient's family aware of transfer today. RN to call report to residential hospice center

## 2015-09-21 NOTE — Discharge Instructions (Signed)
Acetaminophen; Hydrocodone tablets or capsules What is this medicine? ACETAMINOPHEN; HYDROCODONE (a set a MEE noe fen; hye droe KOE done) is a pain reliever. It is used to treat moderate to severe pain. This medicine may be used for other purposes; ask your health care provider or pharmacist if you have questions. What should I tell my health care provider before I take this medicine? They need to know if you have any of these conditions: -brain tumor -Crohn's disease, inflammatory bowel disease, or ulcerative colitis -drug abuse or addiction -head injury -heart or circulation problems -if you often drink alcohol -kidney disease or problems going to the bathroom -liver disease -lung disease, asthma, or breathing problems -an unusual or allergic reaction to acetaminophen, hydrocodone, other opioid analgesics, other medicines, foods, dyes, or preservatives -pregnant or trying to get pregnant -breast-feeding How should I use this medicine? Take this medicine by mouth. Swallow it with a full glass of water. Follow the directions on the prescription label. If the medicine upsets your stomach, take the medicine with food or milk. Do not take more than you are told to take. Talk to your pediatrician regarding the use of this medicine in children. This medicine is not approved for use in children. Patients over 65 years may have a stronger reaction and need a smaller dose. Overdosage: If you think you have taken too much of this medicine contact a poison control center or emergency room at once. NOTE: This medicine is only for you. Do not share this medicine with others. What if I miss a dose? If you miss a dose, take it as soon as you can. If it is almost time for your next dose, take only that dose. Do not take double or extra doses. What may interact with this medicine? -alcohol -antihistamines -isoniazid -medicines for depression, anxiety, or psychotic disturbances -medicines for  sleep -muscle relaxants -naltrexone -narcotic medicines (opiates) for pain -phenobarbital -ritonavir -tramadol This list may not describe all possible interactions. Give your health care provider a list of all the medicines, herbs, non-prescription drugs, or dietary supplements you use. Also tell them if you smoke, drink alcohol, or use illegal drugs. Some items may interact with your medicine. What should I watch for while using this medicine? Tell your doctor or health care professional if your pain does not go away, if it gets worse, or if you have new or a different type of pain. You may develop tolerance to the medicine. Tolerance means that you will need a higher dose of the medicine for pain relief. Tolerance is normal and is expected if you take the medicine for a long time. Do not suddenly stop taking your medicine because you may develop a severe reaction. Your body becomes used to the medicine. This does NOT mean you are addicted. Addiction is a behavior related to getting and using a drug for a non-medical reason. If you have pain, you have a medical reason to take pain medicine. Your doctor will tell you how much medicine to take. If your doctor wants you to stop the medicine, the dose will be slowly lowered over time to avoid any side effects. You may get drowsy or dizzy when you first start taking the medicine or change doses. Do not drive, use machinery, or do anything that may be dangerous until you know how the medicine affects you. Stand or sit up slowly. There are different types of narcotic medicines (opiates) for pain. If you take more than one type at the   same time, you may have more side effects. Give your health care provider a list of all medicines you use. Your doctor will tell you how much medicine to take. Do not take more medicine than directed. Call emergency for help if you have problems breathing. The medicine will cause constipation. Try to have a bowel movement at  least every 2 to 3 days. If you do not have a bowel movement for 3 days, call your doctor or health care professional. Too much acetaminophen can be very dangerous. Do not take Tylenol (acetaminophen) or medicines that contain acetaminophen with this medicine. Many non-prescription medicines contain acetaminophen. Always read the labels carefully. What side effects may I notice from receiving this medicine? Side effects that you should report to your doctor or health care professional as soon as possible: -allergic reactions like skin rash, itching or hives, swelling of the face, lips, or tongue -breathing problems -confusion -feeling faint or lightheaded, falls -stomach pain -yellowing of the eyes or skin Side effects that usually do not require medical attention (report to your doctor or health care professional if they continue or are bothersome): -nausea, vomiting -stomach upset This list may not describe all possible side effects. Call your doctor for medical advice about side effects. You may report side effects to FDA at 1-800-FDA-1088. Where should I keep my medicine? Keep out of the reach of children. This medicine can be abused. Keep your medicine in a safe place to protect it from theft. Do not share this medicine with anyone. Selling or giving away this medicine is dangerous and against the law. This medicine may cause accidental overdose and death if it taken by other adults, children, or pets. Mix any unused medicine with a substance like cat litter or coffee grounds. Then throw the medicine away in a sealed container like a sealed bag or a coffee can with a lid. Do not use the medicine after the expiration date. Store at room temperature between 15 and 30 degrees C (59 and 86 degrees F). NOTE: This sheet is a summary. It may not cover all possible information. If you have questions about this medicine, talk to your doctor, pharmacist, or health care provider.    2016,  [employer]. (2014-04-21 15:29:20)  

## 2015-10-03 DEATH — deceased

## 2016-10-30 IMAGING — CR DG CHEST 1V PORT
1 series · 1 of 1 positions shown · non-contrast
Comparison: None.

CLINICAL DATA: Fall

EXAM:
PORTABLE CHEST 1 VIEW

[AP]
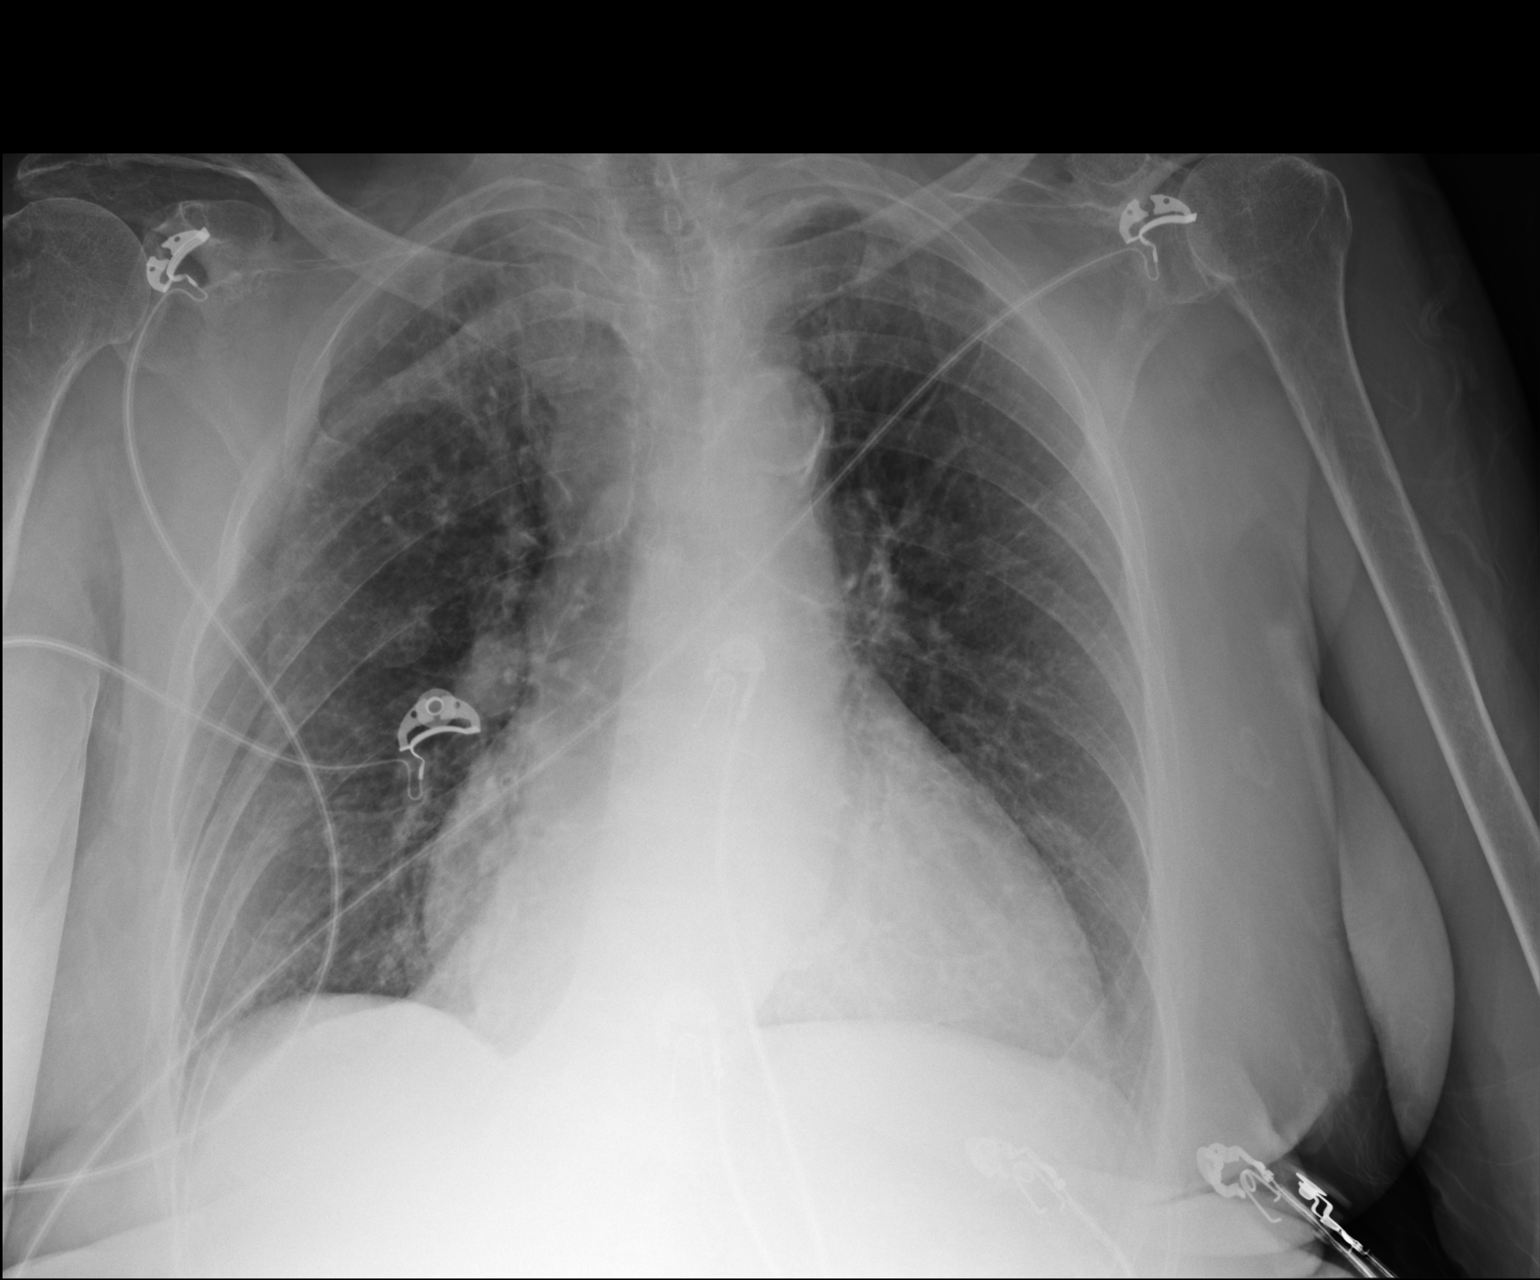

[1 of 1 positions shown; findings below may reference images not displayed]

FINDINGS: Lungs are clear. Heart is enlarged with pulmonary vascularity within
normal limits. No adenopathy. There is a skin fold on the right but
no pneumothorax. No fractures are evident. There is degenerative
change in the shoulders bilaterally. Atherosclerotic calcification
is noted in the aortic arch region.
IMPRESSION: No edema or consolidation. There is cardiomegaly. There is a skin
fold on the right but no apparent pneumothorax.

## 2016-11-01 IMAGING — CR DG ABD PORTABLE 1V
1 series · 1 of 1 positions shown · non-contrast
Comparison: 09/16/2015.

CLINICAL DATA: Feeding tube placement.

EXAM:
PORTABLE ABDOMEN - 1 VIEW

[AP]
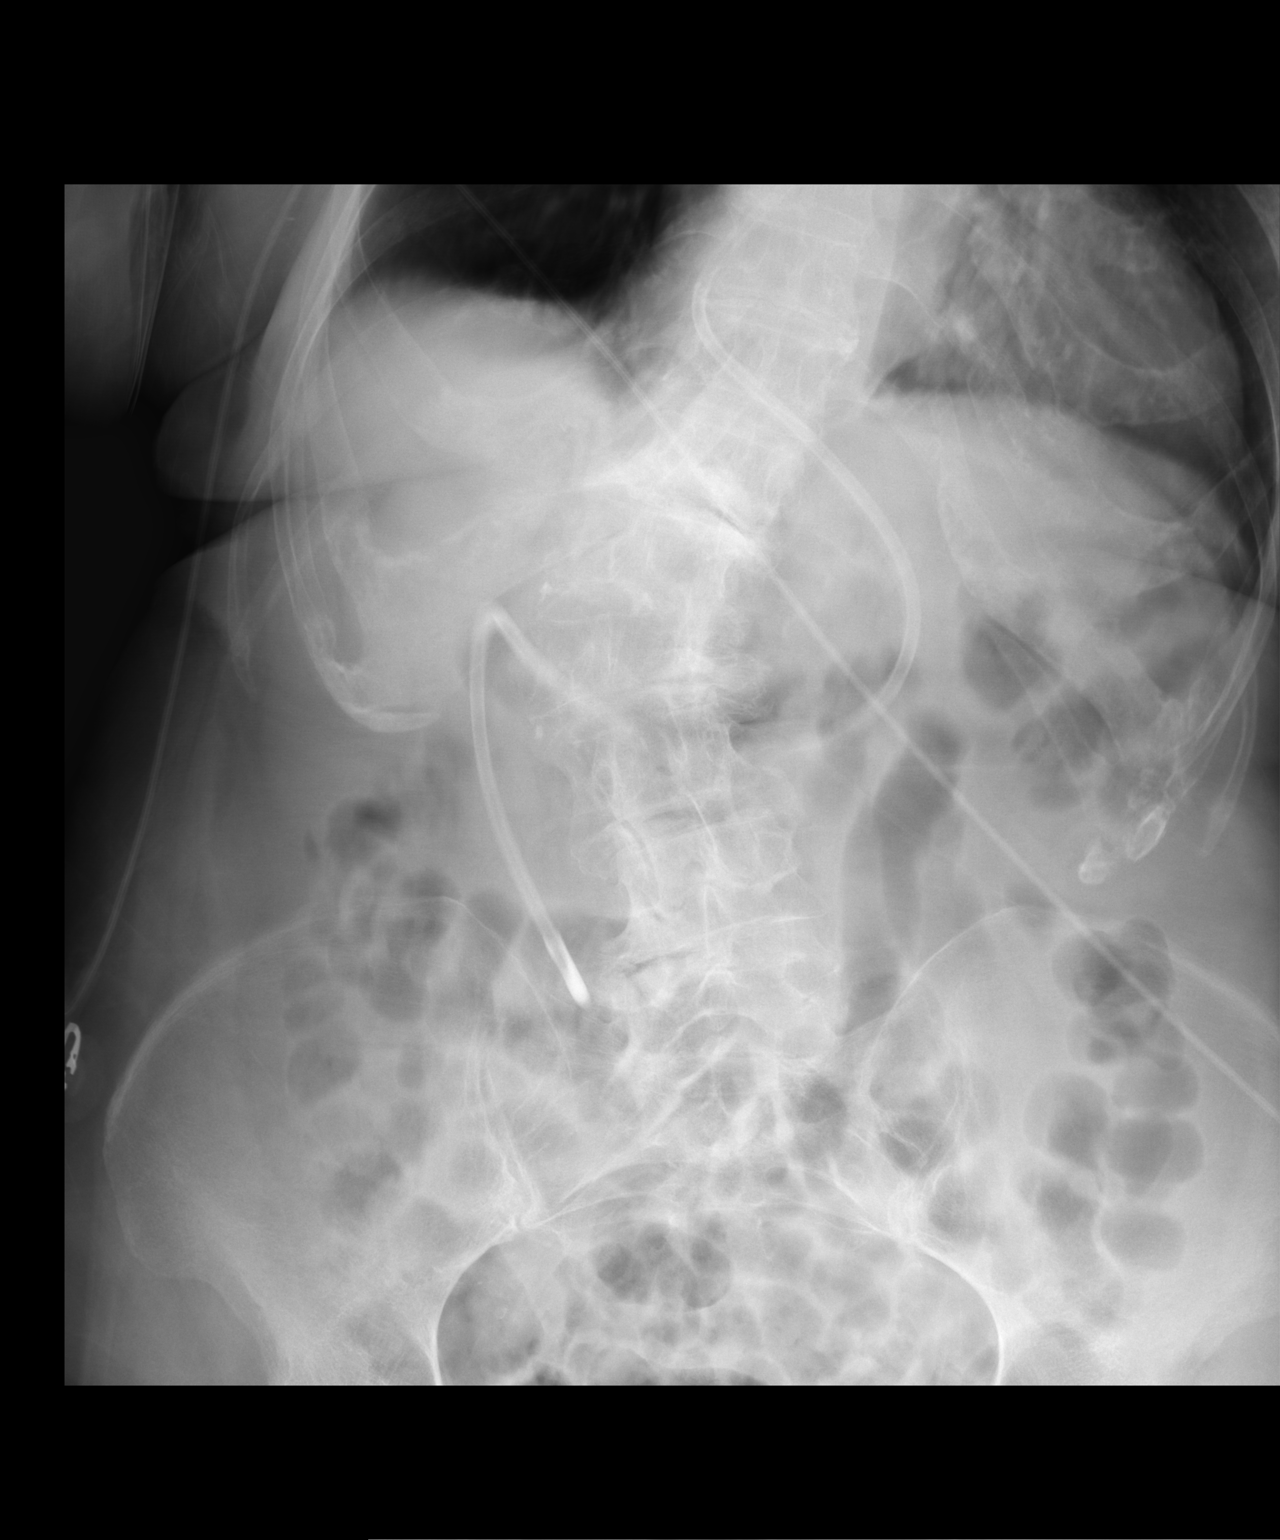

[1 of 1 positions shown; findings below may reference images not displayed]

FINDINGS: Feeding tube terminates in the descending duodenum. Bowel gas
pattern is unremarkable. Lung bases are grossly clear.
IMPRESSION: Feeding tube terminates in the descending duodenum.
